# Patient Record
Sex: Female | Born: 1975 | Race: White | Hispanic: No | Marital: Married | Smoking: Never smoker
Health system: Southern US, Community
[De-identification: ages and names within clinical notes are randomized; demographics above are authoritative.]

## PROBLEM LIST (undated history)

## (undated) DIAGNOSIS — D649 Anemia, unspecified: Secondary | ICD-10-CM

## (undated) DIAGNOSIS — F909 Attention-deficit hyperactivity disorder, unspecified type: Secondary | ICD-10-CM

## (undated) DIAGNOSIS — R51 Headache: Secondary | ICD-10-CM

## (undated) DIAGNOSIS — R519 Headache, unspecified: Secondary | ICD-10-CM

## (undated) DIAGNOSIS — T7840XA Allergy, unspecified, initial encounter: Secondary | ICD-10-CM

## (undated) DIAGNOSIS — G7112 Myotonia congenita: Secondary | ICD-10-CM

## (undated) DIAGNOSIS — T8859XA Other complications of anesthesia, initial encounter: Secondary | ICD-10-CM

## (undated) DIAGNOSIS — F411 Generalized anxiety disorder: Secondary | ICD-10-CM

## (undated) HISTORY — DX: Attention-deficit hyperactivity disorder, unspecified type: F90.9

## (undated) HISTORY — DX: Myotonia congenita: G71.12

## (undated) HISTORY — DX: Anemia, unspecified: D64.9

## (undated) HISTORY — DX: Generalized anxiety disorder: F41.1

## (undated) HISTORY — PX: COSMETIC SURGERY: SHX468

## (undated) HISTORY — PX: OTHER SURGICAL HISTORY: SHX169

## (undated) HISTORY — PX: MANDIBLE SURGERY: SHX707

## (undated) HISTORY — DX: Headache, unspecified: R51.9

## (undated) HISTORY — DX: Allergy, unspecified, initial encounter: T78.40XA

## (undated) HISTORY — PX: REDUCTION MAMMAPLASTY: SUR839

## (undated) HISTORY — DX: Headache: R51

---

## 2002-01-11 ENCOUNTER — Other Ambulatory Visit: Admission: RE | Admit: 2002-01-11 | Discharge: 2002-01-11 | Payer: Self-pay | Admitting: Obstetrics and Gynecology

## 2003-06-01 ENCOUNTER — Other Ambulatory Visit: Admission: RE | Admit: 2003-06-01 | Discharge: 2003-06-01 | Payer: Self-pay | Admitting: Obstetrics and Gynecology

## 2003-06-27 ENCOUNTER — Ambulatory Visit (HOSPITAL_COMMUNITY): Admission: RE | Admit: 2003-06-27 | Discharge: 2003-06-27 | Payer: Self-pay | Admitting: Orthopedic Surgery

## 2003-11-24 HISTORY — PX: OTHER SURGICAL HISTORY: SHX169

## 2004-08-11 ENCOUNTER — Other Ambulatory Visit: Admission: RE | Admit: 2004-08-11 | Discharge: 2004-08-11 | Payer: Self-pay | Admitting: Obstetrics and Gynecology

## 2004-11-25 ENCOUNTER — Ambulatory Visit: Payer: Self-pay | Admitting: Family Medicine

## 2005-01-20 ENCOUNTER — Ambulatory Visit: Payer: Self-pay | Admitting: Family Medicine

## 2005-04-27 ENCOUNTER — Ambulatory Visit: Payer: Self-pay | Admitting: Family Medicine

## 2005-08-31 ENCOUNTER — Other Ambulatory Visit: Admission: RE | Admit: 2005-08-31 | Discharge: 2005-08-31 | Payer: Self-pay | Admitting: Obstetrics and Gynecology

## 2005-09-30 ENCOUNTER — Emergency Department (HOSPITAL_COMMUNITY): Admission: EM | Admit: 2005-09-30 | Discharge: 2005-09-30 | Payer: Self-pay | Admitting: Emergency Medicine

## 2006-02-09 ENCOUNTER — Ambulatory Visit: Payer: Self-pay | Admitting: Family Medicine

## 2006-02-15 ENCOUNTER — Ambulatory Visit: Payer: Self-pay | Admitting: Family Medicine

## 2008-02-06 ENCOUNTER — Ambulatory Visit: Payer: Self-pay | Admitting: Family Medicine

## 2008-02-06 DIAGNOSIS — Z9189 Other specified personal risk factors, not elsewhere classified: Secondary | ICD-10-CM | POA: Insufficient documentation

## 2008-02-06 DIAGNOSIS — D649 Anemia, unspecified: Secondary | ICD-10-CM

## 2008-02-06 DIAGNOSIS — H60399 Other infective otitis externa, unspecified ear: Secondary | ICD-10-CM | POA: Insufficient documentation

## 2008-06-07 ENCOUNTER — Telehealth: Payer: Self-pay | Admitting: Family Medicine

## 2008-06-25 ENCOUNTER — Ambulatory Visit: Payer: Self-pay | Admitting: Family Medicine

## 2008-06-25 DIAGNOSIS — M461 Sacroiliitis, not elsewhere classified: Secondary | ICD-10-CM

## 2008-09-06 ENCOUNTER — Telehealth: Payer: Self-pay | Admitting: Family Medicine

## 2008-09-07 ENCOUNTER — Ambulatory Visit: Payer: Self-pay | Admitting: Family Medicine

## 2008-09-07 DIAGNOSIS — J069 Acute upper respiratory infection, unspecified: Secondary | ICD-10-CM | POA: Insufficient documentation

## 2009-01-03 ENCOUNTER — Inpatient Hospital Stay (HOSPITAL_COMMUNITY): Admission: AD | Admit: 2009-01-03 | Discharge: 2009-01-04 | Payer: Self-pay | Admitting: Obstetrics and Gynecology

## 2009-02-11 ENCOUNTER — Inpatient Hospital Stay (HOSPITAL_COMMUNITY): Admission: RE | Admit: 2009-02-11 | Discharge: 2009-02-14 | Payer: Self-pay | Admitting: Obstetrics and Gynecology

## 2009-02-15 ENCOUNTER — Encounter: Admission: RE | Admit: 2009-02-15 | Discharge: 2009-03-17 | Payer: Self-pay | Admitting: Obstetrics and Gynecology

## 2009-03-18 ENCOUNTER — Encounter: Admission: RE | Admit: 2009-03-18 | Discharge: 2009-04-16 | Payer: Self-pay | Admitting: Obstetrics and Gynecology

## 2009-04-09 ENCOUNTER — Ambulatory Visit: Payer: Self-pay | Admitting: Family Medicine

## 2009-04-09 DIAGNOSIS — L723 Sebaceous cyst: Secondary | ICD-10-CM

## 2009-04-09 DIAGNOSIS — H6120 Impacted cerumen, unspecified ear: Secondary | ICD-10-CM

## 2009-04-17 ENCOUNTER — Encounter: Admission: RE | Admit: 2009-04-17 | Discharge: 2009-05-17 | Payer: Self-pay | Admitting: Obstetrics and Gynecology

## 2009-05-18 ENCOUNTER — Encounter: Admission: RE | Admit: 2009-05-18 | Discharge: 2009-06-16 | Payer: Self-pay | Admitting: Obstetrics and Gynecology

## 2009-06-17 ENCOUNTER — Encounter: Admission: RE | Admit: 2009-06-17 | Discharge: 2009-07-17 | Payer: Self-pay | Admitting: Obstetrics and Gynecology

## 2009-07-18 ENCOUNTER — Encounter: Admission: RE | Admit: 2009-07-18 | Discharge: 2009-08-17 | Payer: Self-pay | Admitting: Obstetrics and Gynecology

## 2009-08-05 ENCOUNTER — Ambulatory Visit: Payer: Self-pay | Admitting: Family Medicine

## 2009-08-05 DIAGNOSIS — M546 Pain in thoracic spine: Secondary | ICD-10-CM

## 2009-08-05 DIAGNOSIS — B354 Tinea corporis: Secondary | ICD-10-CM | POA: Insufficient documentation

## 2009-08-18 ENCOUNTER — Encounter: Admission: RE | Admit: 2009-08-18 | Discharge: 2009-09-16 | Payer: Self-pay | Admitting: Obstetrics and Gynecology

## 2009-09-05 ENCOUNTER — Ambulatory Visit: Payer: Self-pay | Admitting: Family Medicine

## 2009-09-17 ENCOUNTER — Encounter: Admission: RE | Admit: 2009-09-17 | Discharge: 2009-10-17 | Payer: Self-pay | Admitting: Obstetrics and Gynecology

## 2009-10-18 ENCOUNTER — Encounter: Admission: RE | Admit: 2009-10-18 | Discharge: 2009-11-16 | Payer: Self-pay | Admitting: Obstetrics and Gynecology

## 2009-10-29 ENCOUNTER — Telehealth: Payer: Self-pay | Admitting: Family Medicine

## 2009-11-20 ENCOUNTER — Ambulatory Visit: Payer: Self-pay | Admitting: Psychology

## 2009-12-02 ENCOUNTER — Ambulatory Visit: Payer: Self-pay | Admitting: Psychology

## 2009-12-12 ENCOUNTER — Ambulatory Visit: Payer: Self-pay | Admitting: Psychology

## 2009-12-17 ENCOUNTER — Ambulatory Visit: Payer: Self-pay | Admitting: Psychology

## 2009-12-18 ENCOUNTER — Encounter: Admission: RE | Admit: 2009-12-18 | Discharge: 2010-01-17 | Payer: Self-pay | Admitting: Obstetrics and Gynecology

## 2009-12-25 ENCOUNTER — Ambulatory Visit: Payer: Self-pay | Admitting: Psychology

## 2010-01-07 ENCOUNTER — Telehealth: Payer: Self-pay | Admitting: Family Medicine

## 2010-01-07 ENCOUNTER — Ambulatory Visit: Payer: Self-pay | Admitting: Psychology

## 2010-01-16 ENCOUNTER — Ambulatory Visit: Payer: Self-pay | Admitting: Psychology

## 2010-01-18 ENCOUNTER — Encounter: Admission: RE | Admit: 2010-01-18 | Discharge: 2010-02-17 | Payer: Self-pay | Admitting: Obstetrics and Gynecology

## 2010-02-06 ENCOUNTER — Ambulatory Visit: Payer: Self-pay | Admitting: Psychology

## 2010-02-13 ENCOUNTER — Ambulatory Visit: Payer: Self-pay | Admitting: Psychology

## 2010-02-18 ENCOUNTER — Ambulatory Visit: Payer: Self-pay | Admitting: Family Medicine

## 2010-02-18 LAB — CONVERTED CEMR LAB
Bilirubin Urine: NEGATIVE
Glucose, Urine, Semiquant: NEGATIVE
Ketones, urine, test strip: NEGATIVE
Nitrite: NEGATIVE
Protein, U semiquant: NEGATIVE
Specific Gravity, Urine: 1.025
Urobilinogen, UA: 0.2
WBC Urine, dipstick: NEGATIVE
pH: 5.5

## 2010-02-19 LAB — CONVERTED CEMR LAB
ALT: 17 units/L (ref 0–35)
AST: 18 units/L (ref 0–37)
Albumin: 4 g/dL (ref 3.5–5.2)
Alkaline Phosphatase: 49 units/L (ref 39–117)
BUN: 9 mg/dL (ref 6–23)
Basophils Absolute: 0.1 10*3/uL (ref 0.0–0.1)
Basophils Relative: 0.6 % (ref 0.0–3.0)
Bilirubin, Direct: 0.1 mg/dL (ref 0.0–0.3)
CO2: 29 meq/L (ref 19–32)
Calcium: 8.9 mg/dL (ref 8.4–10.5)
Chloride: 105 meq/L (ref 96–112)
Cholesterol: 159 mg/dL (ref 0–200)
Creatinine, Ser: 0.8 mg/dL (ref 0.4–1.2)
Eosinophils Absolute: 0.1 10*3/uL (ref 0.0–0.7)
Eosinophils Relative: 1 % (ref 0.0–5.0)
GFR calc non Af Amer: 87.82 mL/min (ref >60)
Glucose, Bld: 92 mg/dL (ref 70–99)
HCT: 39.1 % (ref 36.0–46.0)
HDL: 41.7 mg/dL (ref >39.00)
Hemoglobin: 12.8 g/dL (ref 12.0–15.0)
LDL Cholesterol: 90 mg/dL (ref 0–99)
Lymphocytes Relative: 20.7 % (ref 12.0–46.0)
Lymphs Abs: 1.8 10*3/uL (ref 0.7–4.0)
MCHC: 32.7 g/dL (ref 30.0–36.0)
MCV: 88.5 fL (ref 78.0–100.0)
Monocytes Absolute: 0.6 10*3/uL (ref 0.1–1.0)
Monocytes Relative: 7 % (ref 3.0–12.0)
Neutro Abs: 6.2 10*3/uL (ref 1.4–7.7)
Neutrophils Relative %: 70.7 % (ref 43.0–77.0)
Platelets: 252 10*3/uL (ref 150.0–400.0)
Potassium: 4.3 meq/L (ref 3.5–5.1)
RBC: 4.41 M/uL (ref 3.87–5.11)
RDW: 12.1 % (ref 11.5–14.6)
Sodium: 141 meq/L (ref 135–145)
TSH: 2 microintl units/mL (ref 0.35–5.50)
Total Bilirubin: 0.7 mg/dL (ref 0.3–1.2)
Total CHOL/HDL Ratio: 4
Total Protein: 7.1 g/dL (ref 6.0–8.3)
Triglycerides: 138 mg/dL (ref 0.0–149.0)
VLDL: 27.6 mg/dL (ref 0.0–40.0)
WBC: 8.8 10*3/uL (ref 4.5–10.5)

## 2010-02-26 ENCOUNTER — Ambulatory Visit: Payer: Self-pay | Admitting: Family Medicine

## 2010-02-27 ENCOUNTER — Ambulatory Visit: Payer: Self-pay | Admitting: Psychology

## 2010-03-13 ENCOUNTER — Telehealth: Payer: Self-pay | Admitting: Family Medicine

## 2010-03-20 ENCOUNTER — Encounter: Admission: RE | Admit: 2010-03-20 | Discharge: 2010-04-02 | Payer: Self-pay | Admitting: Obstetrics and Gynecology

## 2010-03-27 ENCOUNTER — Ambulatory Visit: Payer: Self-pay | Admitting: Psychology

## 2010-04-10 ENCOUNTER — Ambulatory Visit: Payer: Self-pay | Admitting: Psychology

## 2010-04-24 ENCOUNTER — Ambulatory Visit: Payer: Self-pay | Admitting: Psychology

## 2010-04-25 ENCOUNTER — Telehealth: Payer: Self-pay | Admitting: Family Medicine

## 2010-04-27 DIAGNOSIS — J209 Acute bronchitis, unspecified: Secondary | ICD-10-CM

## 2010-05-01 ENCOUNTER — Ambulatory Visit: Payer: Self-pay | Admitting: Psychology

## 2010-05-02 ENCOUNTER — Ambulatory Visit: Payer: Self-pay | Admitting: Family Medicine

## 2010-05-02 ENCOUNTER — Telehealth: Payer: Self-pay | Admitting: Family Medicine

## 2010-05-02 DIAGNOSIS — E669 Obesity, unspecified: Secondary | ICD-10-CM | POA: Insufficient documentation

## 2010-05-05 ENCOUNTER — Telehealth (INDEPENDENT_AMBULATORY_CARE_PROVIDER_SITE_OTHER): Payer: Self-pay | Admitting: *Deleted

## 2010-05-06 ENCOUNTER — Telehealth: Payer: Self-pay | Admitting: Family Medicine

## 2010-05-08 ENCOUNTER — Ambulatory Visit: Payer: Self-pay | Admitting: Psychology

## 2010-05-15 ENCOUNTER — Ambulatory Visit: Payer: Self-pay | Admitting: Psychology

## 2010-05-27 ENCOUNTER — Ambulatory Visit: Payer: Self-pay | Admitting: Psychology

## 2010-11-18 ENCOUNTER — Telehealth: Payer: Self-pay | Admitting: Family Medicine

## 2010-12-25 NOTE — Progress Notes (Signed)
Summary: need another rx  Phone Note Call from Patient Call back at Home Phone [phone]   Caller: Patient-live call Summary of Call: Has another yeast infection and would like another rx for the cream. Please return call. Monistat does'nt help. Initial call taken by: Warnell Forester,  March 13, 2010 2:53 PM  Follow-up for Phone Call        [facility] Follow-up by: Raechel Ache, RN,  March 17, 2010 11:33 AM  Additional Follow-up for Phone Call Additional follow up Details #1::        has yeast inf- says you've given her a cream in the past - Monistat doesn't help. [facility] Additional Follow-up by: Raechel Ache, RN,  March 17, 2010 12:09 PM    Additional Follow-up for Phone Call Additional follow up Details #2::    call in Terazol 7 day cream, use as directed, with 5 rf Follow-up by: Nelwyn Salisbury MD,  March 17, 2010 1:57 PM  New/Updated Medications: TERAZOL 3 0.8 % CREA (TERCONAZOLE) UAD Prescriptions: TERAZOL 3 0.8 % CREA (TERCONAZOLE) UAD  #30 x 5   Entered by:   Raechel Ache, RN   Authorized by:   Nelwyn Salisbury MD   Signed by:   Raechel Ache, RN on 03/17/2010   Method used:   Electronically to        [facility] [address] [phone]* (retail)       [address]       [place], [place]  [address]       Ph: [phone] or [phone]       Fax: [phone]   RxID:   [id number]

## 2010-12-25 NOTE — Assessment & Plan Note (Signed)
Summary: ear pain/dm   Vital Signs:  Patient profile:   35 year old female Menstrual status:  regular Height:      62.5 inches (158.75 cm) Weight:      222 pounds (100.91 kg) O2 Sat:      98 % on Room air Temp:     98.3 degrees F (36.83 degrees C) oral Pulse rate:   105 / minute BP sitting:   124 / 78  (left arm) Cuff size:   large  Vitals Entered By: Josph Macho RMA (May 02, 2010 11:25 AM)  O2 Flow:  Room air CC: ear pain in right ear- noticed blood in ear last night/pt states she is no longer using Ketoconazole or Terazol/ pt also stated she lost RX for Phentermine so she hasn't started/  CF Is Patient Diabetic? No   History of Present Illness: Patient in with recent history of sinusitis treated with a Z pack. She was struggling with facial pressure/congestion and malaise prior to the Zpack and those symptoms have improved somewhat but she is having a worsening cough and now chest congestion. Cough is disrupting her sleep and she has trouble with coughing spasms, Tylenol pm has been helping her rest some but she has not tried any other OTC meds. She acknowledges being a mouth breather and even in good health she reports trouble breathing through her nose. She denies f/c/ear pain/CP/palp/SOB/pharyngitis/GI c/o. Is struggling with fatigue and weight gain  Allergies: No Known Drug Allergies  Past History:  Past medical history reviewed for relevance to current acute and chronic problems. Social history (including risk factors) reviewed for relevance to current acute and chronic problems.  Past Medical History: Reviewed history from 02/26/2010 and no changes required. Anemia-NOS Chickenpox sees Dr. Laruth Bouchard for GYN exams  Social History: Reviewed history from 07/19/2007 and no changes required. Single Never Smoked Alcohol use-yes Drug use-no  Review of Systems      See HPI  Physical Exam  General:  Well-developed,well-nourished,in no acute distress;  alert,appropriate and cooperative throughout examination Head:  Normocephalic and atraumatic without obvious abnormalities. Eyes:  No corneal or conjunctival inflammation noted. EOMI. Perrla. Funduscopic exam benign, without hemorrhages, exudates or papilledema. Vision grossly normal. Ears:  no external deformities.  No erythema or discharge in canals, Air fluid levels noted behind b/l TMs Neck:  No deformities, masses, or tenderness noted. Lungs:  normal respiratory effort, no accessory muscle use, and no wheezes.  Rhonchi noted in LUL most notably Heart:  Normal rate and regular rhythm. S1 and S2 normal without gallop, murmur, click, rub or other extra sounds. Abdomen:  Bowel sounds positive,abdomen soft and non-tender without masses, organomegaly or hernias noted. Extremities:  No clubbing, cyanosis, edema, or deformity noted with normal full range of motion of all joints.   Psych:  Cognition and judgment appear intact. Alert and cooperative with normal attention span and concentration. No apparent delusions, illusions, hallucinations   ENT Exam:  Left Ear:     External: Pinna and periauricular area is normal.       Canal: Ear canal skin is not inflamed or edematous.       Tympanic Membrane: MEE-serous.   Right Ear:     External: Pinna and periauricular area is normal.       Canal: Ear canal skin is not inflamed or edematous.       Tympanic Membrane: MEE-serous.   Nose:     External: No deformity or lesions noted.  Septum: Midline and intact.       Mucosa: pale edema.       Turbinates: hypertrophy.  Narrow nasal passages Pharynx:     Oral Cavity: Lips, gums, and teeth are unremarkable. Tongue is mobile.      Oropharynx: Pharynx without signs of inflammation, tonsils unremarkable, palate with bilateral rise.    Impression & Recommendations:  Problem # 1:  ACUTE BRONCHITIS (ICD-466.0)  Her updated medication list for this problem includes:    Ciprofloxacin Hcl 500 Mg Tabs  (Ciprofloxacin hcl) .Marland Kitchen... 1 tab by mouth two times a day x 10day. Recommended Mucinex OTC two times a day for next 10 days, given sample of Omnaris 2 sprays each day as needed congestion and if helpful given an rx for Fluticasone nasal spray to try if ongoing congestion. Narrow nasal passages noted, consider ENT evaluation if symptoms of sinusitis and allergies persist.  Problem # 2:  OVERWEIGHT (ICD-278.02) Encouraged 7-8 hours of sleep each night, increased exercise, avoid trans fats and avoid partially hydrogenated oils.  Complete Medication List: 1)  Ketoconazole 2 % Crea (Ketoconazole) .... Apply three times a day as needed 2)  Phentermine Hcl 37.5 Mg Caps (Phentermine hcl) .... Once daily 3)  Terazol 3 0.8 % Crea (Terconazole) .... Uad 4)  Omnaris 50 Mcg/act Susp (Ciclesonide) .... 2 sprays each nostril daily as needed for congestion 5)  Fluticasone Propionate 50 Mcg/act Susp (Fluticasone propionate) .... 2 sprays each nostril once daily 6)  Ciprofloxacin Hcl 500 Mg Tabs (Ciprofloxacin hcl) .Marland Kitchen.. 1 tab by mouth two times a day x 10day  Patient Instructions: 1)  Please schedule a follow-up appointment as needed if no symptom resolution. Try Omnaris 2 sprays each nostril daily as needed for nasal congestion. Use Mucinex OTC twice daily for next 10 days as well. 2)  Acute Bronchitis symptoms for less then 10 days are not  helped by antibiotics. Take over the counter cough medications. Call if no improvement in 5-7 days, sooner if increasing cough, fever, or new symptoms ( shortness of breath, chest pain) .  Prescriptions: CIPROFLOXACIN HCL 500 MG TABS (CIPROFLOXACIN HCL) 1 tab by mouth two times a day x 10day  #20 x 0   Entered and Authorized by:   Danise Edge MD   Signed by:   Danise Edge MD on 05/02/2010   Method used:   Electronically to        [facility] [address] [phone]* (retail)       [address]       [place], [place]  [address]       Ph: [phone] or  [phone]       Fax: [phone]   RxID:   [id number] FLUTICASONE PROPIONATE 50 MCG/ACT  SUSP (FLUTICASONE PROPIONATE) 2 sprays each nostril once daily  #1 vial x 1   Entered and Authorized by:   Danise Edge MD   Signed by:   Danise Edge MD on 05/02/2010   Method used:   Electronically to        [facility] [address] [phone]* (retail)       [address]       [place], [place]  [address]       Ph: [phone] or [phone]       Fax: [phone]   RxID:   [id number] OMNARIS 50 MCG/ACT SUSP (CICLESONIDE) 2 sprays each nostril daily as needed  for congestion  #1 x 0   Entered and Authorized by:   Danise Edge MD   Signed by:   Danise Edge MD on 05/02/2010   Method used:   Samples Given   RxID:   [id number]

## 2010-12-25 NOTE — Progress Notes (Signed)
Summary: f/u otitis  Phone Note Call from Patient   Summary of Call: call from nurse line  Lori Zavala did not get her ear drop today at her visit from Dr Rogelia Rohrer  she got cipro and nasal spray  Lori Zavala phone is [phone]   Follow-up for Phone Call        I reviewed Lori Zavala's note from Dr Rogelia Rohrer with the Lori Zavala in detail I did not notice anything in her progress note about ear canal findings or otitis externa  there is no mention of ear drops of any kind I explained this to Lori Zavala in detail  perhaps the oral cipro would cover all problems ?  Lori Zavala is not having pain now- but is worried about developing ear "canal" infection because that is what always happens when she gets a uri  I told her again - there was no ear drop recommended we agreed that if she develops any symptoms of otitis externa to let me know asap or come into saturday clinic  Follow-up by: Judith Part MD,  May 02, 2010 10:31 PM  Additional Follow-up for Phone Call Additional follow up Details #1::        Reviewed phone note, patient treatment should be adequate with by mouth antibiotics, if symptoms worsen patient has already been instructed to seek further care. Additional Follow-up by: Danise Edge MD,  May 05, 2010 4:38 PM

## 2010-12-25 NOTE — Assessment & Plan Note (Signed)
Summary: cpx/no pap/njr   Vital Signs:  Patient profile:   35 year old female Menstrual status:  regular LMP:     02/12/2010 Height:      62.5 inches Weight:      222 pounds BMI:     40.10 Temp:     98.3 degrees F oral Pulse rate:   84 / minute Pulse rhythm:   regular Resp:     12 per minute BP sitting:   108 / 80  (left arm)  Vitals Entered By: Gladis Riffle, RN (February 26, 2010 3:07 PM) CC: cpx, has gyn, labs done Is Patient Diabetic? No LMP (date): 02/12/2010     Menstrual Status regular Enter LMP: 02/12/2010   History of Present Illness: 35 yr old female for a cpx. She feels good physically except for frequent diarrhea. This persists depsite any dietary changes she makes. She thinks it is related to stress, and after talking to her I agree. She has been under a lot of stress lately since her one year old infant has failure to thrive. He is at the 25th percentile for height but at less than 1% for weight. his pediatrician has refered them to Lori Zavala for more workup. he sleeps all the time and won't eat much. She worries about him all the time. She has been seeing Dr. Dellia Cloud weekly for therapy, and this has helped some. She also would like some help losing weight. She watches her diet but has little time for exercise.   Preventive Screening-Counseling & Management  Alcohol-Tobacco     Smoking Status: never  Current Medications (verified): 1)  Ketoconazole 2 % Crea (Ketoconazole) .... Apply Three Times A Day As Needed  Allergies (verified): No Known Drug Allergies  Past History:  Past Medical History: Anemia-NOS Chickenpox sees Dr. Laruth Bouchard for GYN exams  Past Surgical History: Reviewed history from 02/06/2008 and no changes required. Jaw surgery 1992 Caesarean section  Family History: Reviewed history from 07/19/2007 and no changes required. Family History of Alcoholism/Addiction Family History Other cancer-breast Family History Diabetes 1st degree  relative Family History High cholesterol Family History Hypertension  Social History: Reviewed history from 07/19/2007 and no changes required. Single Never Smoked Alcohol use-yes Drug use-no  Review of Systems  The patient denies anorexia, fever, weight loss, vision loss, decreased hearing, hoarseness, chest pain, syncope, dyspnea on exertion, peripheral edema, prolonged cough, headaches, hemoptysis, abdominal pain, melena, hematochezia, severe indigestion/heartburn, hematuria, incontinence, genital sores, muscle weakness, suspicious skin lesions, transient blindness, difficulty walking, depression, unusual weight change, abnormal bleeding, enlarged lymph nodes, angioedema, breast masses, and testicular masses.    Physical Exam  General:  overweight-appearing.   Head:  Normocephalic and atraumatic without obvious abnormalities. No apparent alopecia or balding. Eyes:  No corneal or conjunctival inflammation noted. EOMI. Perrla. Funduscopic exam benign, without hemorrhages, exudates or papilledema. Vision grossly normal. Ears:  External ear exam shows no significant lesions or deformities.  Otoscopic examination reveals clear canals, tympanic membranes are intact bilaterally without bulging, retraction, inflammation or discharge. Hearing is grossly normal bilaterally. Nose:  External nasal examination shows no deformity or inflammation. Nasal mucosa are pink and moist without lesions or exudates. Mouth:  Oral mucosa and oropharynx without lesions or exudates.  Teeth in good repair. Neck:  No deformities, masses, or tenderness noted. Chest Wall:  No deformities, masses, or tenderness noted. Lungs:  Normal respiratory effort, chest expands symmetrically. Lungs are clear to auscultation, no crackles or wheezes. Heart:  Normal rate and regular rhythm.  S1 and S2 normal without gallop, murmur, click, rub or other extra sounds. Abdomen:  Bowel sounds positive,abdomen soft and non-tender without  masses, organomegaly or hernias noted. Msk:  No deformity or scoliosis noted of thoracic or lumbar spine.   Pulses:  R and L carotid,radial,femoral,dorsalis pedis and posterior tibial pulses are full and equal bilaterally Extremities:  No clubbing, cyanosis, edema, or deformity noted with normal full range of motion of all joints.   Neurologic:  No cranial nerve deficits noted. Station and gait are normal. Plantar reflexes are down-going bilaterally. DTRs are symmetrical throughout. Sensory, motor and coordinative functions appear intact. Skin:  Intact without suspicious lesions or rashes Cervical Nodes:  No lymphadenopathy noted Axillary Nodes:  No palpable lymphadenopathy Inguinal Nodes:  No significant adenopathy Psych:  Oriented X3, memory intact for recent and remote, normally interactive, good eye contact, and moderately anxious.     Impression & Recommendations:  Problem # 1:  HEALTH MAINTENANCE EXAM (ICD-V70.0)  Complete Medication List: 1)  Ketoconazole 2 % Crea (Ketoconazole) .... Apply three times a day as needed 2)  Phentermine Hcl 37.5 Mg Caps (Phentermine hcl) .... Once daily  Patient Instructions: 1)  It is important that you exercise reguarly at least 20 minutes 5 times a week. If you develop chest pain, have severe difficulty breathing, or feel very tired, stop exercising immediately and seek medical attention.  2)  You need to lose weight. Consider a lower calorie diet and regular exercise.  Prescriptions: PHENTERMINE HCL 37.5 MG CAPS (PHENTERMINE HCL) once daily  #30 x 5   Entered and Authorized by:   Nelwyn Salisbury MD   Signed by:   Nelwyn Salisbury MD on 02/26/2010   Method used:   Print then Give to Patient   RxID:   [id number] PHENTERMINE HCL 37.5 MG CAPS (PHENTERMINE HCL) once daily  #30 x 5   Entered and Authorized by:   Nelwyn Salisbury MD   Signed by:   Nelwyn Salisbury MD on 02/26/2010   Method used:   Print then Give to Patient   RxID:   [id number]

## 2010-12-25 NOTE — Progress Notes (Signed)
Summary: antibiotic  Phone Note Call from Patient Call back at [facility] Phone [phone]   Summary of Call: Sinus infection.  Yellow nasty mucus with some blood in it.  Throat drainage.  Painful between eyes & upper nose.  Cannot get babysitter for her 2 young children, ages 1 & 3, today.  [facility]  NKDA.  Not on any meds & knows she's not pregnant IUD.   Initial call taken by: Rudy Jew, RN,  April 25, 2010 12:18 PM  Follow-up for Phone Call        call in a Zpack Follow-up by: Nelwyn Salisbury MD,  April 25, 2010 1:03 PM  Additional Follow-up for Phone Call Additional follow up Details #1::        Rx Called In Additional Follow-up by: Raechel Ache, RN,  April 25, 2010 1:10 PM

## 2010-12-25 NOTE — Progress Notes (Signed)
Summary: Phentermine refill request  Phone Note From Pharmacy   [facility] [address] [phone]* Call For: Lori Zavala  Summary of Call: Faxed request to fill Phentermine 37.5.  Last filled 05/07/10 Initial call taken by: Sid Falcon LPN,  November 18, 2010 1:25 PM  Follow-up for Phone Call        call in #30 with 5 rf Follow-up by: Nelwyn Salisbury MD,  November 19, 2010 8:31 AM    Prescriptions: PHENTERMINE HCL 37.5 MG CAPS (PHENTERMINE HCL) once daily  #30 x 5   Entered by:   Kyung Rudd, CMA   Authorized by:   Nelwyn Salisbury MD   Signed by:   Kyung Rudd, CMA on 11/19/2010   Method used:   Telephoned to ...       [facility] [address] [phone]* (retail)       [address]       [place], [place]  [address]       Ph: [phone] or [phone]       Fax: [phone]   RxID:   [id number]  Rx phoned in

## 2010-12-25 NOTE — Progress Notes (Signed)
Summary: lost prescription  Phone Note Call from Patient   Caller: Patient Call For: Nelwyn Salisbury MD Summary of Call: Pt lost her Phentermine prescription before getting it filled.  Her son spent 2 weeks at [facility], and has not been diagnosed. [phone] Initial call taken by: Lynann Beaver CMA,  May 06, 2010 8:20 AM  Follow-up for Phone Call        call in Phnetermine 37.5 mg once daily , #30 with 5 rf Follow-up by: Nelwyn Salisbury MD,  May 07, 2010 12:28 PM    Prescriptions: PHENTERMINE HCL 37.5 MG CAPS (PHENTERMINE HCL) once daily  #30 x 5   Entered by:   Raechel Ache, RN   Authorized by:   Nelwyn Salisbury MD   Signed by:   Raechel Ache, RN on 05/07/2010   Method used:   Printed then faxed to ...       [facility] [address] [phone]* (retail)       [address]       [place], [place]  [address]       Ph: [phone] or [phone]       Fax: [phone]   RxID:   [id number]

## 2010-12-25 NOTE — Progress Notes (Signed)
Summary: Call-A-Nurse Report    Call-A-Nurse Triage Call Report Triage Record Num: [id number] Operator: Albertine Grates Patient Name: Lori Zavala Call Date & Time: 05/02/2010 9:43:02PM Patient Phone: [phone] PCP: Tera Mater. Clent Ridges Patient Gender: Female PCP Fax : [phone] Patient DOB: May 22, 1976 Practice Name: [facility] Reason for Call: Was seen in office 6-10 due to ear infection. Was given Cipro for fluid in lung. Was told has redness of ear canal and MD was to call in ear drops but did not receive drops. Dr. Milinda Antis notified and advised would look up and call pt. Protocol(s) Used: Office Note Recommended Outcome per Protocol: Information Noted and Sent to Office Reason for Outcome: Caller information to office Care Advice:  ~ 05/02/2010 10:14:16PM Page 1 of 1 [id number]  See phone note dated 05/02/10 by Dr Milinda Antis

## 2010-12-25 NOTE — Progress Notes (Signed)
Summary: vomiting & diarrhea  Phone Note Call from Patient   Caller: Patient Call For: Nelwyn Salisbury MD Summary of Call: baby has Norovirus and she has had it since this morning- vomiting & diarrhea. Are there any meds to help her? [facility] Initial call taken by: Raechel Ache, RN,  January 07, 2010 4:09 PM  Follow-up for Phone Call        call in Phenergan 25 mg tabs to use q 4 hours as needed nausea, #30 with no rf. She can use Imodium AD for the diarrhea. Drink plenty of fluids.  Follow-up by: Nelwyn Salisbury MD,  January 07, 2010 5:03 PM  Additional Follow-up for Phone Call Additional follow up Details #1::        Pt. notified. Additional Follow-up by: Lynann Beaver CMA,  January 07, 2010 5:29 PM    New/Updated Medications: PROMETHAZINE HCL 25 MG TABS (PROMETHAZINE HCL) one by mouth q 4 hours as needed nausea Prescriptions: PROMETHAZINE HCL 25 MG TABS (PROMETHAZINE HCL) one by mouth q 4 hours as needed nausea  #30 x 0   Entered by:   Lynann Beaver CMA   Authorized by:   Nelwyn Salisbury MD   Signed by:   Lynann Beaver CMA on 01/07/2010   Method used:   Electronically to        [facility] [address] [phone]* (retail)       [address]       [place], [place]  [address]       Ph: [phone] or [phone]       Fax: [phone]   RxID:   [id number]  Pt. notified.

## 2011-03-05 LAB — CBC
HCT: 29.4 % — ABNORMAL LOW (ref 36.0–46.0)
HCT: 33.7 % — ABNORMAL LOW (ref 36.0–46.0)
Hemoglobin: 10 g/dL — ABNORMAL LOW (ref 12.0–15.0)
Hemoglobin: 11.3 g/dL — ABNORMAL LOW (ref 12.0–15.0)
MCHC: 33.5 g/dL (ref 30.0–36.0)
MCHC: 34.1 g/dL (ref 30.0–36.0)
MCV: 87.3 fL (ref 78.0–100.0)
MCV: 87.6 fL (ref 78.0–100.0)
Platelets: 157 10*3/uL (ref 150–400)
Platelets: 211 10*3/uL (ref 150–400)
RBC: 3.36 MIL/uL — ABNORMAL LOW (ref 3.87–5.11)
RBC: 3.85 MIL/uL — ABNORMAL LOW (ref 3.87–5.11)
RDW: 14 % (ref 11.5–15.5)
RDW: 14.1 % (ref 11.5–15.5)
WBC: 8.2 10*3/uL (ref 4.0–10.5)
WBC: 8.6 10*3/uL (ref 4.0–10.5)

## 2011-03-05 LAB — CCBB MATERNAL DONOR DRAW

## 2011-03-05 LAB — TYPE AND SCREEN
ABO/RH(D): A POS
Antibody Screen: NEGATIVE

## 2011-03-05 LAB — RPR: RPR Ser Ql: NONREACTIVE

## 2011-03-05 LAB — ABO/RH: ABO/RH(D): A POS

## 2011-03-10 LAB — URINALYSIS, ROUTINE W REFLEX MICROSCOPIC
Bilirubin Urine: NEGATIVE
Glucose, UA: NEGATIVE mg/dL
Hgb urine dipstick: NEGATIVE
Ketones, ur: 15 mg/dL — AB
Nitrite: NEGATIVE
Protein, ur: NEGATIVE mg/dL
Specific Gravity, Urine: 1.025 (ref 1.005–1.030)
Urobilinogen, UA: 0.2 mg/dL (ref 0.0–1.0)
pH: 6 (ref 5.0–8.0)

## 2011-03-10 LAB — FETAL FIBRONECTIN: Fetal Fibronectin: NEGATIVE

## 2011-04-07 NOTE — Op Note (Signed)
Lori Zavala, Lori Zavala                ACCOUNT NO.:  [id number]   MEDICAL RECORD NO.:  [id number]         PATIENT TYPE:  WINP   LOCATION:                                FACILITY:  [facility]   PHYSICIAN:  Zelphia Cairo, MD    DATE OF BIRTH:  28-May-1976   DATE OF PROCEDURE:  DATE OF DISCHARGE:  02/14/2009                               OPERATIVE REPORT   DATE OF PROCEDURE:  February 11, 2009.   PREOPERATIVE DIAGNOSES:  1. Intrauterine pregnancy at term.  2. Prior cesarean section, desires repeat.   POSTOPERATIVE DIAGNOSES:  1. Intrauterine pregnancy at term.  2. Prior cesarean section, desires repeat.   PROCEDURE:  Repeat low-transverse cesarean delivery.   SURGEON:  Zelphia Cairo, MD   ANESTHESIA:  Spinal epidural.   FINDINGS:  Viable female infant with Apgars of 5 and 8, pH 7.3, weight 9.3  ounces, normal-appearing pelvis.   SPECIMEN:  Placenta for disposal.   COMPLICATIONS:  None.   CONDITION:  Stable to recovery room.   PROCEDURE:  [name] was taken to the operating room where spinal epidural  was found to be adequate.  She was prepped and draped in sterile fashion  and a Foley catheter was inserted sterilely.  A Pfannenstiel skin  incision was made with a scalpel and carried down to the underlying  fascia.  The fascia was incised in the midline.  This was extended  laterally using curved Mayo scissors.  Kocher clamps were used to grasp  the superior portion of the fascia.  This was tented upwards and the  underlying rectus muscles were dissected off using curved Mayo scissors.  The inferior portion of the rectus muscles were dissected off the fascia  using Mayo scissors.  The peritoneum was then tented upwards with 2  hemostats and entered sharply using Metzenbaum, this was extended  superiorly and inferiorly with good visualization of the bladder.  The  bladder blade was then inserted, the vesicouterine peritoneum was  incised and dissected off the lower uterine segment.  The  bladder blade  was then replaced.   Uterine incision was made with a scalpel and extended bluntly.  The  fetal vertex was brought through the uterine incision.  The mouth and  nose were suctioned.  Nuchal cord x1 was easily reduced and the  shoulders and body followed with fundal pressure.  The cord was clamped  and cut as the mouth and nose were suctioned and the infant was taken to  the awaiting pediatric staff.  The placenta was then manually removed  from the uterus.  The uterus was cleared of all clots and debris using a  dry lap sponge.  The uterine incision was reapproximated using a double  closure of 0 Chromic in a running locked fashion.  Hemostasis was  assured.  Uterus was placed back into the pelvis.  The pelvis was copiously irrigated with warm normal saline.  The uterine  incision was re-inspectedd and found to be hemostatic.  The peritoneum  was then reapproximated with 0 Monocryl.  The fascia was closed with a  looped 0 PDS  and the skin was closed with staples.  Sponge lap, needle,  and instrument counts were correct x2.      Zelphia Cairo, MD  Electronically Signed     GA/MEDQ  D:  02/11/2009  T:  02/11/2009  Job:  [id number]

## 2011-04-07 NOTE — Discharge Summary (Signed)
NAMEMARGOT, Zavala                ACCOUNT NO.:  [id number]   MEDICAL RECORD NO.:  [id number]          PATIENT TYPE:  INP   LOCATION:  9118                          FACILITY:  [facility]   PHYSICIAN:  Guy Sandifer. Henderson Cloud, M.D. DATE OF BIRTH:  Jul 04, 1976   DATE OF ADMISSION:  02/11/2009  DATE OF DISCHARGE:  02/14/2009                               DISCHARGE SUMMARY   ADMITTING DIAGNOSES:  1. Intrauterine pregnancy at term.  2. Previous cesarean section, desires repeat.   DISCHARGE DIAGNOSIS:  Status post low-transverse cesarean section to a  viable female infant.   PROCEDURE:  Repeat low-transverse cesarean section.   REASON FOR ADMISSION:  Please see written H&P.   HOSPITAL COURSE:  The patient is a 35 year old gravida 2, para 1 who was  admitted to [facility], [facility] for scheduled cesarean section.  The patient had a previous cesarean sectiona and desired a repeat.  On  the morning of admission, the patient was taken to the operating room  where a spinal anesthesia was administered without difficulty.  A low-  transverse incision was made with delivery of a viable female infant  weighing 9 pounds 3 ounces.  The Apgars were 5 at 1 minute and 8 at 5  minutes.  Arterial cord pH was 7.30.  The patient tolerated feeds well  and was taken to the recovery room in stable condition.  On  postoperative day #1, the patient was known to be sensitive to Percocet  and she had been given Dilaudid IV for control of pain.   Vital signs were stable.  She was afebrile.  Abdomen was soft with good  return of bowel function.  Fundus was firm and nontender.  Abdominal  dressings were noted to have a small amount of drainage noted on the  bandage.  Foley had been discontinued.  The patient was voiding well.   Laboratory findings showed hemoglobin of 10.0, platelet count 157,000,  WBC count of 8.6.  Blood type was known to be A+.  The patient's pain  medications were changed to Dilaudid p.o.  She was  able to shower and  dressing was subsequently removed.  On postoperative day #2, the patient  was having good relief of pain with Dilaudid; however, the baby was  somewhat sleepy.  Vital signs were stable.  She was afebrile.  Fundus  firm and nontender.  Incision was clean, dry, and intact.  She was  ambulating well.  On postoperative day #3, the patient was without  complaint.  Vital signs were stable.  She is afebrile.  Fundus is firm  and nontender.  There was some slight erythema noted superior to the  incisional site.  Staples were removed, Steri-Strips were applied and  the patient was later discharged home.   CONDITION ON DISCHARGE:  Stable.  Diet regular as tolerated.  Activity,  no heavy lifting, no driving x2 weeks, no vaginal entry.   FOLLOWUP:  The patient to follow up in the office in 1 week for an  incision check.  She is to call for temperature greater than 100  degrees, persistent  nausea, vomiting, heavy vaginal bleeding, and/or  redness or drainage from the incisional site.   DISCHARGE MEDICATIONS:  Motrin 600 mg every 6 hours, prenatal vitamins 1  p.o. daily, Keflex 500 mg one p.o. q.i.d. times 7 days.      Julio Sicks, N.P.      Guy Sandifer. Henderson Cloud, M.D.  Electronically Signed    CC/MEDQ  D:  02/14/2009  T:  02/14/2009  Job:  [id number]

## 2013-11-27 ENCOUNTER — Telehealth: Payer: Self-pay | Admitting: Family Medicine

## 2013-11-27 NOTE — Telephone Encounter (Signed)
Sorry but she will need to see someone else thanks

## 2013-11-27 NOTE — Telephone Encounter (Signed)
Patient would like to return as patient with Dr. Sarajane Jews because she has moved back into the area from [place]. I did advise her that he is not accepting new patients. Contact info [phone].

## 2013-11-29 NOTE — Telephone Encounter (Signed)
Pt is aware.  

## 2014-07-24 HISTORY — PX: BREAST SURGERY: SHX581

## 2014-11-23 HISTORY — PX: NASAL SEPTUM SURGERY: SHX37

## 2014-12-03 ENCOUNTER — Encounter: Payer: Self-pay | Admitting: Family Medicine

## 2014-12-03 ENCOUNTER — Ambulatory Visit (INDEPENDENT_AMBULATORY_CARE_PROVIDER_SITE_OTHER): Payer: 59 | Admitting: General Practice

## 2014-12-03 ENCOUNTER — Ambulatory Visit (INDEPENDENT_AMBULATORY_CARE_PROVIDER_SITE_OTHER): Payer: 59 | Admitting: Family Medicine

## 2014-12-03 VITALS — BP 110/80 | HR 80 | Temp 98.8°F | Resp 16 | Ht 63.0 in | Wt 198.2 lb

## 2014-12-03 DIAGNOSIS — E669 Obesity, unspecified: Secondary | ICD-10-CM

## 2014-12-03 DIAGNOSIS — Z23 Encounter for immunization: Secondary | ICD-10-CM

## 2014-12-03 MED ORDER — PHENTERMINE HCL 37.5 MG PO CAPS: 37.5000 mg | ORAL_CAPSULE | ORAL | Status: DC

## 2014-12-03 NOTE — Patient Instructions (Signed)
Follow up in 6 weeks to recheck weight loss progress, BP and pulse (due to phentermine) We'll notify you of your lab results and make any changes if needed Keep up the good work on healthy diet and regular exercise Call with any questions or concerns Welcome!  We're glad to have you!!!

## 2014-12-03 NOTE — Progress Notes (Signed)
Pre visit review using our clinic review tool, if applicable. No additional management support is needed unless otherwise documented below in the visit note. 

## 2014-12-03 NOTE — Assessment & Plan Note (Signed)
New to provider, ongoing issue for pt since birth of 2nd child 5 yrs ago.  Following w/ personal trainer for 6 workouts/week and meal plan but recently suffered ankle injury which has curbed her workouts for the near future.  Pt has done well on phentermine previously- losing 50 lbs and only regaining 8.  Willing to restart phentermine after checking labs to r/o thyroid abnormality that would prevent weight loss and checking lipids and electrolytes to risk stratify.  Applauded efforts to eat right and exercise.  Will follow closely.

## 2014-12-03 NOTE — Progress Notes (Signed)
   Subjective:    Patient ID: Lori Zavala, female    DOB: June 06, 1976, 39 y.o.   MRN: [id number]  HPI New to establish.  No recent PCP, prior to that [place].  GYN- [facility]- previously seeing Dr Gershon Crane but he is nearing retirement.  Needs referral.  Obesity- pt has personal trainer who is outlining 6 days of exercise and following meal plans.  Had grade 3 sprain of L ankle on 12/24.  Gained weight after 2nd pregnancy due to son's chronic illness.  Has taken Phentermine previously and lost 50 lbs.  Only gained 8 back after stopping phentermine.   Review of Systems For ROS see HPI     Objective:   Physical Exam  Constitutional: She is oriented to person, place, and time. She appears well-developed and well-nourished. No distress.  obese  HENT:  Head: Normocephalic and atraumatic.  Eyes: Conjunctivae and EOM are normal. Pupils are equal, round, and reactive to light.  Neck: Normal range of motion. Neck supple. No thyromegaly present.  Cardiovascular: Normal rate, regular rhythm, normal heart sounds and intact distal pulses.   No murmur heard. Pulmonary/Chest: Effort normal and breath sounds normal. No respiratory distress.  Abdominal: Soft. She exhibits no distension. There is no tenderness.  Musculoskeletal: She exhibits no edema.  Lymphadenopathy:    She has no cervical adenopathy.  Neurological: She is alert and oriented to person, place, and time.  Skin: Skin is warm and dry.  Psychiatric: She has a normal mood and affect. Her behavior is normal.  Vitals reviewed.         Assessment & Plan:

## 2014-12-04 LAB — CBC WITH DIFFERENTIAL/PLATELET
Basophils Absolute: 0.1 10*3/uL (ref 0.0–0.1)
Basophils Relative: 1.3 % (ref 0.0–3.0)
Eosinophils Absolute: 0.1 10*3/uL (ref 0.0–0.7)
Eosinophils Relative: 1.5 % (ref 0.0–5.0)
HCT: 38.1 % (ref 36.0–46.0)
Hemoglobin: 12.4 g/dL (ref 12.0–15.0)
Lymphocytes Relative: 28.1 % (ref 12.0–46.0)
Lymphs Abs: 2.2 10*3/uL (ref 0.7–4.0)
MCHC: 32.5 g/dL (ref 30.0–36.0)
MCV: 86.5 fl (ref 78.0–100.0)
Monocytes Absolute: 0.6 10*3/uL (ref 0.1–1.0)
Monocytes Relative: 7.6 % (ref 3.0–12.0)
Neutro Abs: 4.9 10*3/uL (ref 1.4–7.7)
Neutrophils Relative %: 61.5 % (ref 43.0–77.0)
Platelets: 227 10*3/uL (ref 150.0–400.0)
RBC: 4.41 Mil/uL (ref 3.87–5.11)
RDW: 14.5 % (ref 11.5–15.5)
WBC: 7.9 10*3/uL (ref 4.0–10.5)

## 2014-12-04 LAB — TSH: TSH: 1.44 u[IU]/mL (ref 0.35–4.50)

## 2014-12-04 LAB — LIPID PANEL
Cholesterol: 154 mg/dL (ref 0–200)
HDL: 35.6 mg/dL — ABNORMAL LOW (ref >39.00)
LDL Cholesterol: 95 mg/dL (ref 0–99)
NonHDL: 118.4
Total CHOL/HDL Ratio: 4
Triglycerides: 118 mg/dL (ref 0.0–149.0)
VLDL: 23.6 mg/dL (ref 0.0–40.0)

## 2014-12-04 LAB — HEPATIC FUNCTION PANEL
ALT: 15 U/L (ref 0–35)
AST: 21 U/L (ref 0–37)
Albumin: 3.8 g/dL (ref 3.5–5.2)
Alkaline Phosphatase: 44 U/L (ref 39–117)
Bilirubin, Direct: 0 mg/dL (ref 0.0–0.3)
Total Bilirubin: 0.4 mg/dL (ref 0.2–1.2)
Total Protein: 6.6 g/dL (ref 6.0–8.3)

## 2014-12-04 LAB — BASIC METABOLIC PANEL
BUN: 18 mg/dL (ref 6–23)
CO2: 25 mEq/L (ref 19–32)
Calcium: 8.8 mg/dL (ref 8.4–10.5)
Chloride: 106 mEq/L (ref 96–112)
Creatinine, Ser: 0.7 mg/dL (ref 0.4–1.2)
GFR: 98.04 mL/min (ref >60.00)
Glucose, Bld: 82 mg/dL (ref 70–99)
Potassium: 3.7 mEq/L (ref 3.5–5.1)
Sodium: 136 mEq/L (ref 135–145)

## 2014-12-05 ENCOUNTER — Encounter: Payer: Self-pay | Admitting: General Practice

## 2014-12-07 ENCOUNTER — Telehealth: Payer: Self-pay | Admitting: *Deleted

## 2014-12-07 NOTE — Telephone Encounter (Signed)
Medical records received via fax from [facility]. Forwarded to Dr. Birdie Riddle. JG//CMA

## 2015-01-14 ENCOUNTER — Ambulatory Visit: Payer: 59 | Admitting: Family Medicine

## 2015-01-15 ENCOUNTER — Encounter: Payer: Self-pay | Admitting: Family Medicine

## 2015-01-15 ENCOUNTER — Ambulatory Visit (INDEPENDENT_AMBULATORY_CARE_PROVIDER_SITE_OTHER): Payer: 59 | Admitting: Family Medicine

## 2015-01-15 VITALS — BP 112/78 | HR 97 | Temp 98.2°F | Resp 16 | Wt 192.0 lb

## 2015-01-15 DIAGNOSIS — E669 Obesity, unspecified: Secondary | ICD-10-CM

## 2015-01-15 NOTE — Patient Instructions (Signed)
Schedule your complete physical in 6 months Keep up the good work!  You look great! Call with any questions or concerns Happy [place]!

## 2015-01-15 NOTE — Assessment & Plan Note (Signed)
Pt looks good.  Losing weight and appears much more toned.  No side effects from phentermine so will continue at this time.  Will follow.

## 2015-01-15 NOTE — Progress Notes (Signed)
   Subjective:    Patient ID: Lori Zavala, female    DOB: 1976-08-28, 39 y.o.   MRN: [id number]  HPI Obesity- pt was started on phentermine at last visit.  Has lost more than 6 lbs.  Denies CP, SOB, HAs, visual changes, edema, palpitations, insomnia.  Pt has been exercising at least 4 days/week and following diet plan as outlined by personal trainer.     Review of Systems For ROS see HPI     Objective:   Physical Exam  Constitutional: She is oriented to person, place, and time. She appears well-developed and well-nourished. No distress.  HENT:  Head: Normocephalic and atraumatic.  Eyes: Conjunctivae and EOM are normal. Pupils are equal, round, and reactive to light.  Neck: Normal range of motion. Neck supple. No thyromegaly present.  Cardiovascular: Normal rate, regular rhythm, normal heart sounds and intact distal pulses.   No murmur heard. Pulmonary/Chest: Effort normal and breath sounds normal. No respiratory distress.  Abdominal: Soft. She exhibits no distension. There is no tenderness.  Musculoskeletal: She exhibits no edema.  Lymphadenopathy:    She has no cervical adenopathy.  Neurological: She is alert and oriented to person, place, and time.  Skin: Skin is warm and dry.  Psychiatric: She has a normal mood and affect. Her behavior is normal.  Vitals reviewed.         Assessment & Plan:

## 2015-01-15 NOTE — Progress Notes (Signed)
Pre visit review using our clinic review tool, if applicable. No additional management support is needed unless otherwise documented below in the visit note. 

## 2015-02-13 ENCOUNTER — Encounter: Payer: Self-pay | Admitting: Physician Assistant

## 2015-02-13 ENCOUNTER — Ambulatory Visit (INDEPENDENT_AMBULATORY_CARE_PROVIDER_SITE_OTHER): Payer: 59 | Admitting: Physician Assistant

## 2015-02-13 ENCOUNTER — Telehealth: Payer: Self-pay | Admitting: Family Medicine

## 2015-02-13 VITALS — BP 128/80 | HR 80 | Temp 98.1°F | Resp 16 | Wt 185.2 lb

## 2015-02-13 DIAGNOSIS — J0100 Acute maxillary sinusitis, unspecified: Secondary | ICD-10-CM | POA: Diagnosis not present

## 2015-02-13 DIAGNOSIS — J322 Chronic ethmoidal sinusitis: Secondary | ICD-10-CM | POA: Insufficient documentation

## 2015-02-13 MED ORDER — FLUTICASONE PROPIONATE 50 MCG/ACT NA SUSP: 2.0000 | Freq: Every day | NASAL | Status: DC

## 2015-02-13 MED ORDER — DOXYCYCLINE HYCLATE 100 MG PO CAPS: 100.0000 mg | ORAL_CAPSULE | Freq: Two times a day (BID) | ORAL | Status: DC

## 2015-02-13 MED ORDER — BENZONATATE 100 MG PO CAPS: 100.0000 mg | ORAL_CAPSULE | Freq: Three times a day (TID) | ORAL | Status: DC | PRN

## 2015-02-13 NOTE — Patient Instructions (Signed)
Please take antibiotic as directed.  Increase fluid intake.  Use Saline nasal spray.  Take a daily multivitamin. Use Tessalon and Flonase as directed.  Place a humidifier in the bedroom.  Please call or return clinic if symptoms are not improving.  Sinusitis Sinusitis is redness, soreness, and swelling (inflammation) of the paranasal sinuses. Paranasal sinuses are air pockets within the bones of your face (beneath the eyes, the middle of the forehead, or above the eyes). In healthy paranasal sinuses, mucus is able to drain out, and air is able to circulate through them by way of your nose. However, when your paranasal sinuses are inflamed, mucus and air can become trapped. This can allow bacteria and other germs to grow and cause infection. Sinusitis can develop quickly and last only a short time (acute) or continue over a long period (chronic). Sinusitis that lasts for more than 12 weeks is considered chronic.  CAUSES  Causes of sinusitis include:  Allergies.  Structural abnormalities, such as displacement of the cartilage that separates your nostrils (deviated septum), which can decrease the air flow through your nose and sinuses and affect sinus drainage.  Functional abnormalities, such as when the small hairs (cilia) that line your sinuses and help remove mucus do not work properly or are not present. SYMPTOMS  Symptoms of acute and chronic sinusitis are the same. The primary symptoms are pain and pressure around the affected sinuses. Other symptoms include:  Upper toothache.  Earache.  Headache.  Bad breath.  Decreased sense of smell and taste.  A cough, which worsens when you are lying flat.  Fatigue.  Fever.  Thick drainage from your nose, which often is green and may contain pus (purulent).  Swelling and warmth over the affected sinuses. DIAGNOSIS  Your caregiver will perform a physical exam. During the exam, your caregiver may:  Look in your nose for signs of abnormal  growths in your nostrils (nasal polyps).  Tap over the affected sinus to check for signs of infection.  View the inside of your sinuses (endoscopy) with a special imaging device with a light attached (endoscope), which is inserted into your sinuses. If your caregiver suspects that you have chronic sinusitis, one or more of the following tests may be recommended:  Allergy tests.  Nasal culture A sample of mucus is taken from your nose and sent to a lab and screened for bacteria.  Nasal cytology A sample of mucus is taken from your nose and examined by your caregiver to determine if your sinusitis is related to an allergy. TREATMENT  Most cases of acute sinusitis are related to a viral infection and will resolve on their own within 10 days. Sometimes medicines are prescribed to help relieve symptoms (pain medicine, decongestants, nasal steroid sprays, or saline sprays).  However, for sinusitis related to a bacterial infection, your caregiver will prescribe antibiotic medicines. These are medicines that will help kill the bacteria causing the infection.  Rarely, sinusitis is caused by a fungal infection. In theses cases, your caregiver will prescribe antifungal medicine. For some cases of chronic sinusitis, surgery is needed. Generally, these are cases in which sinusitis recurs more than 3 times per year, despite other treatments. HOME CARE INSTRUCTIONS   Drink plenty of water. Water helps thin the mucus so your sinuses can drain more easily.  Use a humidifier.  Inhale steam 3 to 4 times a day (for example, sit in the bathroom with the shower running).  Apply a warm, moist washcloth to your face  3 to 4 times a day, or as directed by your caregiver.  Use saline nasal sprays to help moisten and clean your sinuses.  Take over-the-counter or prescription medicines for pain, discomfort, or fever only as directed by your caregiver. SEEK IMMEDIATE MEDICAL CARE IF:  You have increasing pain or  severe headaches.  You have nausea, vomiting, or drowsiness.  You have swelling around your face.  You have vision problems.  You have a stiff neck.  You have difficulty breathing. MAKE SURE YOU:   Understand these instructions.  Will watch your condition.  Will get help right away if you are not doing well or get worse. Document Released: 11/09/2005 Document Revised: 02/01/2012 Document Reviewed: 11/24/2011 ExitCare Patient Information 2014 [employer].   

## 2015-02-13 NOTE — Progress Notes (Signed)
Pre visit review using our clinic review tool, if applicable. No additional management support is needed unless otherwise documented below in the visit note. 

## 2015-02-13 NOTE — Progress Notes (Signed)
   History of Present Illness: Lori Zavala is a 39 y.o. female who present to the clinic today complaining of sinus pressure, sinus pain and nasal congestion over the past week. Patient endorses significant facial pain.  Is having and nonproductive cough. Patient denies chest pain or shortness of breath.  Has taken Mucinex for symptoms with little relief.  History: Past Medical History  Diagnosis Date  . Frequent headaches     Current outpatient prescriptions:  .  phentermine 37.5 MG capsule, Take 1 capsule (37.5 mg total) by mouth every morning., Disp: 30 capsule, Rfl: 2 .  benzonatate (TESSALON) 100 MG capsule, Take 1 capsule (100 mg total) by mouth 3 (three) times daily as needed for cough., Disp: 30 capsule, Rfl: 0 .  doxycycline (VIBRAMYCIN) 100 MG capsule, Take 1 capsule (100 mg total) by mouth 2 (two) times daily., Disp: 20 capsule, Rfl: 0 No Known Allergies Family History  Problem Relation Age of Onset  . Alcohol abuse Father   . Cancer Father     prostate  . Hyperlipidemia Father   . Stroke Father   . Hypertension Father   . Kidney disease Father   . Mental illness Father   . Diabetes Father   . Cancer Maternal Grandmother     breast  . Cancer Paternal Grandmother     breast   History   Social History  . Marital Status: Married    Spouse Name: N/A  . Number of Children: N/A  . Years of Education: N/A   Social History Main Topics  . Smoking status: Never Smoker   . Smokeless tobacco: Not on file  . Alcohol Use: Yes  . Drug Use: No  . Sexual Activity: Yes   Other Topics Concern  . None   Social History Narrative    Review of Systems: See HPI.  All other ROS are negative.  Physical Examination: BP 128/80 mmHg  Pulse 80  Temp(Src) 98.1 F (36.7 C) (Oral)  Resp 16  Wt 185 lb 4 oz (84.029 kg)  SpO2 96%  General appearance: alert, cooperative and appears stated age Head: Normocephalic, without obvious abnormality, atraumatic, sinuses tender to  percussion Eyes: conjunctivae/corneas clear. PERRL, EOM's intact. Fundi benign. Ears: normal TM's and external ear canals both ears Nose: moderate congestion, turbinates swollen, sinus tenderness bilateral Throat: lips, mucosa, and tongue normal; teeth and gums normal Neck: no adenopathy, no carotid bruit, no JVD, supple, symmetrical, trachea midline and thyroid not enlarged, symmetric, no tenderness/mass/nodules Lungs: clear to auscultation bilaterally Chest wall: no tenderness  Assessment/Plan: Acute maxillary sinusitis Rx doxycycline.  Increase fluids.  Rest.  Saline nasal spray.  Probiotic.  Mucinex as directed.  Humidifier in bedroom. Lavella Lemons for cough  Call or return to clinic if symptoms are not improving.

## 2015-02-13 NOTE — Telephone Encounter (Signed)
Rx sent 

## 2015-02-13 NOTE — Telephone Encounter (Signed)
Caller name: Laetitia Relation to pt: self Call back number: [phone] Pharmacy[facility] [place]  Reason for call:   Patient thought that she was going to be prescribed a nasal spray at todays visit. Pharmacy did not receive rx

## 2015-02-13 NOTE — Assessment & Plan Note (Signed)
Rx doxycycline.  Increase fluids.  Rest.  Saline nasal spray.  Probiotic.  Mucinex as directed.  Humidifier in bedroom. Lavella Lemons for cough  Call or return to clinic if symptoms are not improving.

## 2015-03-05 ENCOUNTER — Other Ambulatory Visit: Payer: Self-pay | Admitting: Family Medicine

## 2015-03-05 NOTE — Telephone Encounter (Signed)
Med filled and faxed.  

## 2015-03-05 NOTE — Telephone Encounter (Signed)
Last OV 01/15/15 Phentermine last filled 12-03-14 #30 with 2

## 2015-03-14 ENCOUNTER — Ambulatory Visit (INDEPENDENT_AMBULATORY_CARE_PROVIDER_SITE_OTHER): Payer: 59 | Admitting: Psychology

## 2015-03-14 DIAGNOSIS — F348 Other persistent mood [affective] disorders: Secondary | ICD-10-CM | POA: Diagnosis not present

## 2015-04-03 ENCOUNTER — Telehealth: Payer: Self-pay | Admitting: *Deleted

## 2015-04-03 NOTE — Telephone Encounter (Signed)
Signed medical record request received via fax from [facility] requesting all medical records. Forwarded to [place] to email/fax to medical records. JG//CMA

## 2015-04-11 ENCOUNTER — Ambulatory Visit (INDEPENDENT_AMBULATORY_CARE_PROVIDER_SITE_OTHER): Payer: 59 | Admitting: Psychology

## 2015-04-11 DIAGNOSIS — F348 Other persistent mood [affective] disorders: Secondary | ICD-10-CM | POA: Diagnosis not present

## 2015-04-17 ENCOUNTER — Telehealth: Payer: Self-pay | Admitting: Family Medicine

## 2015-04-17 DIAGNOSIS — S99912S Unspecified injury of left ankle, sequela: Secondary | ICD-10-CM

## 2015-04-17 NOTE — Telephone Encounter (Signed)
Pt called indicating that she would like a 2nd opinion regarding her L ankle injury sustained on 11/15/14.  Pt is requesting Dr Bearl Mulberry at [facility] b/c he is a foot/ankle specialist

## 2015-05-09 ENCOUNTER — Ambulatory Visit (INDEPENDENT_AMBULATORY_CARE_PROVIDER_SITE_OTHER): Payer: 59 | Admitting: Psychology

## 2015-05-09 DIAGNOSIS — F348 Other persistent mood [affective] disorders: Secondary | ICD-10-CM | POA: Diagnosis not present

## 2015-05-19 LAB — HM PAP SMEAR

## 2015-06-05 ENCOUNTER — Ambulatory Visit (INDEPENDENT_AMBULATORY_CARE_PROVIDER_SITE_OTHER): Payer: 59 | Admitting: Psychology

## 2015-06-05 DIAGNOSIS — F348 Other persistent mood [affective] disorders: Secondary | ICD-10-CM

## 2015-06-19 ENCOUNTER — Other Ambulatory Visit: Payer: Self-pay | Admitting: Family Medicine

## 2015-06-19 NOTE — Telephone Encounter (Signed)
Rx faxed to [facility] pharmacy.  

## 2015-06-19 NOTE — Telephone Encounter (Signed)
Pt is requesting refill on Phentermine.  Last OV: 01/15/2015 Last Fill: 03/05/2015 #30 2RF  Please advise.

## 2015-06-19 NOTE — Telephone Encounter (Signed)
[place] for #30, no refills since this is not a maintenance medication

## 2015-06-20 ENCOUNTER — Encounter: Payer: 59 | Admitting: Family Medicine

## 2015-07-11 ENCOUNTER — Ambulatory Visit (INDEPENDENT_AMBULATORY_CARE_PROVIDER_SITE_OTHER): Payer: 59 | Admitting: Psychology

## 2015-07-11 DIAGNOSIS — F348 Other persistent mood [affective] disorders: Secondary | ICD-10-CM

## 2015-07-15 ENCOUNTER — Telehealth: Payer: Self-pay | Admitting: Behavioral Health

## 2015-07-15 ENCOUNTER — Encounter: Payer: 59 | Admitting: Family Medicine

## 2015-07-15 ENCOUNTER — Encounter: Payer: Self-pay | Admitting: Behavioral Health

## 2015-07-15 NOTE — Telephone Encounter (Signed)
Pre-Visit Call completed with patient and chart updated.   Pre-Visit Info documented in Specialty Comments under [employer].    

## 2015-07-16 ENCOUNTER — Encounter: Payer: Self-pay | Admitting: Family Medicine

## 2015-07-16 ENCOUNTER — Ambulatory Visit (INDEPENDENT_AMBULATORY_CARE_PROVIDER_SITE_OTHER): Payer: 59 | Admitting: Family Medicine

## 2015-07-16 VITALS — BP 122/84 | HR 79 | Temp 98.0°F | Resp 16 | Ht 63.0 in | Wt 176.5 lb

## 2015-07-16 DIAGNOSIS — Z23 Encounter for immunization: Secondary | ICD-10-CM | POA: Diagnosis not present

## 2015-07-16 DIAGNOSIS — Z01419 Encounter for gynecological examination (general) (routine) without abnormal findings: Secondary | ICD-10-CM

## 2015-07-16 DIAGNOSIS — Z0000 Encounter for general adult medical examination without abnormal findings: Secondary | ICD-10-CM | POA: Diagnosis not present

## 2015-07-16 LAB — CBC WITH DIFFERENTIAL/PLATELET
Basophils Absolute: 0 10*3/uL (ref 0.0–0.1)
Basophils Relative: 0.4 % (ref 0.0–3.0)
Eosinophils Absolute: 0.1 10*3/uL (ref 0.0–0.7)
Eosinophils Relative: 1.5 % (ref 0.0–5.0)
HCT: 37.3 % (ref 36.0–46.0)
HEMOGLOBIN: 12.4 g/dL (ref 12.0–15.0)
Lymphocytes Relative: 42.5 % (ref 12.0–46.0)
Lymphs Abs: 2 10*3/uL (ref 0.7–4.0)
MCHC: 33.3 g/dL (ref 30.0–36.0)
MCV: 88.9 fl (ref 78.0–100.0)
MONOS PCT: 6.8 % (ref 3.0–12.0)
Monocytes Absolute: 0.3 10*3/uL (ref 0.1–1.0)
Neutro Abs: 2.3 10*3/uL (ref 1.4–7.7)
Neutrophils Relative %: 48.8 % (ref 43.0–77.0)
Platelets: 218 10*3/uL (ref 150.0–400.0)
RBC: 4.2 Mil/uL (ref 3.87–5.11)
RDW: 13.1 % (ref 11.5–15.5)
WBC: 4.8 10*3/uL (ref 4.0–10.5)

## 2015-07-16 LAB — HEPATIC FUNCTION PANEL
ALT: 17 U/L (ref 0–35)
AST: 18 U/L (ref 0–37)
Albumin: 4.1 g/dL (ref 3.5–5.2)
Alkaline Phosphatase: 48 U/L (ref 39–117)
BILIRUBIN TOTAL: 0.6 mg/dL (ref 0.2–1.2)
Bilirubin, Direct: 0.1 mg/dL (ref 0.0–0.3)
Total Protein: 6.7 g/dL (ref 6.0–8.3)

## 2015-07-16 LAB — VITAMIN D 25 HYDROXY (VIT D DEFICIENCY, FRACTURES): VITD: 46.15 ng/mL (ref 30.00–100.00)

## 2015-07-16 LAB — LIPID PANEL
CHOL/HDL RATIO: 3
Cholesterol: 149 mg/dL (ref 0–200)
HDL: 49.3 mg/dL (ref >39.00)
LDL Cholesterol: 88 mg/dL (ref 0–99)
NonHDL: 99.79
Triglycerides: 61 mg/dL (ref 0.0–149.0)
VLDL: 12.2 mg/dL (ref 0.0–40.0)

## 2015-07-16 LAB — BASIC METABOLIC PANEL
BUN: 14 mg/dL (ref 6–23)
CO2: 30 mEq/L (ref 19–32)
Calcium: 9.6 mg/dL (ref 8.4–10.5)
Chloride: 104 mEq/L (ref 96–112)
Creatinine, Ser: 0.77 mg/dL (ref 0.40–1.20)
GFR: 88.99 mL/min (ref >60.00)
Glucose, Bld: 89 mg/dL (ref 70–99)
Potassium: 4.6 mEq/L (ref 3.5–5.1)
Sodium: 139 mEq/L (ref 135–145)

## 2015-07-16 LAB — TSH: TSH: 2.41 u[IU]/mL (ref 0.35–4.50)

## 2015-07-16 NOTE — Assessment & Plan Note (Signed)
Pt's PE WNL.  Due for GYN exam- referral placed.  Check labs.  Encouraged her to continue healthy diet, regular exercise.  Anticipatory guidance provided.

## 2015-07-16 NOTE — Patient Instructions (Signed)
Follow up in 1 year or as needed We'll notify you of your lab results and make any changes if needed Keep up the good work on healthy diet and regular exercise- you look great!! We'll call you with your GYN appt Call with any questions or concerns Good Luck with Back to School!!

## 2015-07-16 NOTE — Progress Notes (Signed)
Pre visit review using our clinic review tool, if applicable. No additional management support is needed unless otherwise documented below in the visit note. 

## 2015-07-16 NOTE — Progress Notes (Signed)
   Subjective:    Patient ID: Lori Zavala, female    DOB: Dec 01, 1975, 39 y.o.   MRN: [id number]  HPI CPE- pt is exercising regularly, working out w/ Physiological scientist and eating much better.  Due for GYN.   Review of Systems Patient reports no vision/ hearing changes, adenopathy,fever, weight change,  persistant/recurrent hoarseness , swallowing issues, chest pain, palpitations, edema, persistant/recurrent cough, hemoptysis, dyspnea (rest/exertional/paroxysmal nocturnal), gastrointestinal bleeding (melena, rectal bleeding), abdominal pain, significant heartburn, bowel changes, GU symptoms (dysuria, hematuria, incontinence), Gyn symptoms (abnormal  bleeding, pain),  syncope, focal weakness, memory loss, numbness & tingling, skin/hair/nail changes, abnormal bruising or bleeding, anxiety, or depression.     Objective:   Physical Exam General Appearance:    Alert, cooperative, no distress, appears stated age  Head:    Normocephalic, without obvious abnormality, atraumatic  Eyes:    PERRL, conjunctiva/corneas clear, EOM's intact, fundi    benign, both eyes  Ears:    Normal TM's and external ear canals, both ears  Nose:   Nares normal, septum midline, mucosa normal, no drainage    or sinus tenderness  Throat:   Lips, mucosa, and tongue normal; teeth and gums normal  Neck:   Supple, symmetrical, trachea midline, no adenopathy;    Thyroid: no enlargement/tenderness/nodules  Back:     Symmetric, no curvature, ROM normal, no CVA tenderness  Lungs:     Clear to auscultation bilaterally, respirations unlabored  Chest Wall:    No tenderness or deformity   Heart:    Regular rate and rhythm, S1 and S2 normal, no murmur, rub   or gallop  Breast Exam:    Deferred to GYN  Abdomen:     Soft, non-tender, bowel sounds active all four quadrants,    no masses, no organomegaly  Genitalia:    Deferred to GYN  Rectal:    Extremities:   Extremities normal, atraumatic, no cyanosis or edema  Pulses:   2+ and  symmetric all extremities  Skin:   Skin color, texture, turgor normal, no rashes or lesions  Lymph nodes:   Cervical, supraclavicular, and axillary nodes normal  Neurologic:   CNII-XII intact, normal strength, sensation and reflexes    throughout          Assessment & Plan:

## 2015-07-17 ENCOUNTER — Encounter: Payer: Self-pay | Admitting: General Practice

## 2015-07-24 ENCOUNTER — Other Ambulatory Visit: Payer: Self-pay | Admitting: Family Medicine

## 2015-07-24 NOTE — Telephone Encounter (Signed)
Last OV 07/16/15 Phentermine last filled 7/27 #30 with 0

## 2015-07-24 NOTE — Telephone Encounter (Signed)
Medication filled to pharmacy as requested.   

## 2015-08-08 ENCOUNTER — Ambulatory Visit (INDEPENDENT_AMBULATORY_CARE_PROVIDER_SITE_OTHER): Payer: 59 | Admitting: Psychology

## 2015-08-08 DIAGNOSIS — F348 Other persistent mood [affective] disorders: Secondary | ICD-10-CM | POA: Diagnosis not present

## 2015-09-05 ENCOUNTER — Ambulatory Visit: Payer: 59 | Admitting: Psychology

## 2015-09-20 ENCOUNTER — Ambulatory Visit (INDEPENDENT_AMBULATORY_CARE_PROVIDER_SITE_OTHER): Payer: 59 | Admitting: Psychology

## 2015-09-20 DIAGNOSIS — F3481 Disruptive mood dysregulation disorder: Secondary | ICD-10-CM | POA: Diagnosis not present

## 2015-09-25 ENCOUNTER — Other Ambulatory Visit: Payer: Self-pay | Admitting: Family Medicine

## 2015-09-25 NOTE — Telephone Encounter (Signed)
Medication filled to pharmacy as requested.   

## 2015-09-25 NOTE — Telephone Encounter (Signed)
Last Ov 07/16/15 Phentermine last filled 07/24/15 #30 with 1

## 2015-10-16 ENCOUNTER — Ambulatory Visit (INDEPENDENT_AMBULATORY_CARE_PROVIDER_SITE_OTHER): Payer: 59 | Admitting: Psychology

## 2015-10-16 DIAGNOSIS — F3489 Other specified persistent mood disorders: Secondary | ICD-10-CM

## 2015-10-28 ENCOUNTER — Other Ambulatory Visit: Payer: Self-pay | Admitting: Family Medicine

## 2015-10-28 NOTE — Telephone Encounter (Signed)
Medication filled to pharmacy as requested.   

## 2015-10-28 NOTE — Telephone Encounter (Signed)
Last refill for 3-6 months as pt needs a wash out period before metabolism is dependent on medication

## 2015-10-28 NOTE — Telephone Encounter (Signed)
Last OV 07-16-15 Phentermine last filled 09/25/15 #30 with 0

## 2015-11-01 ENCOUNTER — Ambulatory Visit (INDEPENDENT_AMBULATORY_CARE_PROVIDER_SITE_OTHER): Payer: 59 | Admitting: Family Medicine

## 2015-11-01 ENCOUNTER — Encounter: Payer: Self-pay | Admitting: Family Medicine

## 2015-11-01 VITALS — BP 120/80 | HR 98 | Temp 98.2°F | Resp 16 | Ht 63.0 in | Wt 172.0 lb

## 2015-11-01 DIAGNOSIS — H00019 Hordeolum externum unspecified eye, unspecified eyelid: Secondary | ICD-10-CM | POA: Insufficient documentation

## 2015-11-01 DIAGNOSIS — H00013 Hordeolum externum right eye, unspecified eyelid: Secondary | ICD-10-CM

## 2015-11-01 NOTE — Patient Instructions (Signed)
Follow up as needed This is a stye Apply hot compresses DO NOT PICK Call with any questions or concerns Hang in there!

## 2015-11-01 NOTE — Progress Notes (Signed)
   Subjective:    Patient ID: Lori Zavala, female    DOB: [birth year], 39 y.o.   MRN: [id number]  HPI R lower lid pain and swelling- pt woke this AM w/ sxs.  No hx of similar.  Not recently rubbing eye.  No change in makeup.  TTP.   Review of Systems For ROS see HPI     Objective:   Physical Exam  Constitutional: She is oriented to person, place, and time. She appears well-developed and well-nourished. No distress.  HENT:  Head: Normocephalic and atraumatic.  Eyes: Conjunctivae and EOM are normal. Pupils are equal, round, and reactive to light.  R lower lid w/ mild erythema and evidence of small sty- no overlying cellulitis  Neurological: She is alert and oriented to person, place, and time.  Skin: Skin is warm. There is erythema (overlying R lower eyelid).  Psychiatric: She has a normal mood and affect. Her behavior is normal. Thought content normal.  Vitals reviewed.         Assessment & Plan:

## 2015-11-01 NOTE — Assessment & Plan Note (Signed)
New.  Reviewed dx and supportive care w/ pt.  No need for abx as there is no overlying infection or evidence of cellulitis.  Hot compresses for symptomatic relief.  Reviewed supportive care and red flags that should prompt return.  Pt expressed understanding and is in agreement w/ plan.

## 2015-11-01 NOTE — Progress Notes (Signed)
Pre visit review using our clinic review tool, if applicable. No additional management support is needed unless otherwise documented below in the visit note. 

## 2015-11-13 ENCOUNTER — Ambulatory Visit (INDEPENDENT_AMBULATORY_CARE_PROVIDER_SITE_OTHER): Payer: 59 | Admitting: Psychology

## 2015-11-13 DIAGNOSIS — F3489 Other specified persistent mood disorders: Secondary | ICD-10-CM | POA: Diagnosis not present

## 2015-11-28 ENCOUNTER — Encounter: Payer: Self-pay | Admitting: Family Medicine

## 2015-11-28 ENCOUNTER — Ambulatory Visit (INDEPENDENT_AMBULATORY_CARE_PROVIDER_SITE_OTHER): Payer: 59 | Admitting: Family Medicine

## 2015-11-28 VITALS — BP 116/77 | HR 88 | Temp 98.7°F | Resp 16 | Ht 63.0 in | Wt 171.0 lb

## 2015-11-28 DIAGNOSIS — J0100 Acute maxillary sinusitis, unspecified: Secondary | ICD-10-CM

## 2015-11-28 MED ORDER — AMOXICILLIN 875 MG PO TABS: 875.0000 mg | ORAL_TABLET | Freq: Two times a day (BID) | ORAL | Status: DC

## 2015-11-28 NOTE — Progress Notes (Signed)
   Subjective:    Patient ID: Lori Zavala, female    DOB: 12/13/1975, 40 y.o.   MRN: [id number]  HPI URI- sxs started as cold 2 weeks ago.  Pt has hx of enlarged turbinates which frequently leads to sinus infection.  + tooth pain.  Painful to lean forward due to sinus pressure.  No dizziness.  No ear pain.  No cough.  No fever.  + sick contacts.  No N/V.   Review of Systems For ROS see HPI     Objective:   Physical Exam  Constitutional: She appears well-developed and well-nourished. No distress.  HENT:  Head: Normocephalic and atraumatic.  Right Ear: Tympanic membrane normal.  Left Ear: Tympanic membrane normal.  Nose: Mucosal edema and rhinorrhea present. Right sinus exhibits maxillary sinus tenderness. Right sinus exhibits no frontal sinus tenderness. Left sinus exhibits maxillary sinus tenderness. Left sinus exhibits no frontal sinus tenderness.  Mouth/Throat: Uvula is midline and mucous membranes are normal. Posterior oropharyngeal erythema present. No oropharyngeal exudate.  Eyes: Conjunctivae and EOM are normal. Pupils are equal, round, and reactive to light.  Neck: Normal range of motion. Neck supple.  Cardiovascular: Normal rate, regular rhythm and normal heart sounds.   Pulmonary/Chest: Effort normal and breath sounds normal. No respiratory distress. She has no wheezes.  Lymphadenopathy:    She has no cervical adenopathy.  Vitals reviewed.         Assessment & Plan:

## 2015-11-28 NOTE — Patient Instructions (Signed)
Follow up as needed Start the Amoxicillin twice daily- take w/ food Drink plenty of fluids Alternate tylenol/ibuprofen for pain relief Call with any questions or concerns Hang in there!!!

## 2015-11-28 NOTE — Progress Notes (Signed)
Pre visit review using our clinic review tool, if applicable. No additional management support is needed unless otherwise documented below in the visit note. 

## 2015-11-28 NOTE — Assessment & Plan Note (Signed)
Pt's sxs and PE consistent w/ infxn.  Start abx.  Reviewed supportive care and red flags that should prompt return.  Pt expressed understanding and is in agreement w/ plan.  

## 2015-12-11 ENCOUNTER — Ambulatory Visit (INDEPENDENT_AMBULATORY_CARE_PROVIDER_SITE_OTHER): Payer: 59 | Admitting: Psychology

## 2015-12-11 DIAGNOSIS — F3481 Disruptive mood dysregulation disorder: Secondary | ICD-10-CM | POA: Diagnosis not present

## 2015-12-31 ENCOUNTER — Telehealth: Payer: Self-pay | Admitting: Family Medicine

## 2015-12-31 NOTE — Telephone Encounter (Signed)
Patient declined receiving flu shot  °

## 2016-01-06 ENCOUNTER — Telehealth: Payer: Self-pay | Admitting: Family Medicine

## 2016-01-06 MED ORDER — OSELTAMIVIR PHOSPHATE 75 MG PO CAPS: 75.0000 mg | ORAL_CAPSULE | Freq: Every day | ORAL | Status: DC

## 2016-01-06 MED ORDER — AMOXICILLIN 875 MG PO TABS: 875.0000 mg | ORAL_TABLET | Freq: Two times a day (BID) | ORAL | Status: DC

## 2016-01-06 MED ORDER — PROMETHAZINE-DM 6.25-15 MG/5ML PO SYRP: 5.0000 mL | ORAL_SOLUTION | Freq: Four times a day (QID) | ORAL | Status: DC | PRN

## 2016-01-06 NOTE — Telephone Encounter (Signed)
Pt called indicating that her daughter tested + for flu today.  Is asking for Tamiflu prophylaxis.  Pt has also been sick herself for over 2 weeks- cough that is not responding to albuterol inhaler, mucinex, cool mist humidifier- and she now has facial pain/pressure consistent w/ her previous sinus infections.  No appts available today.  Will send abx to pharmacy with the understanding that if she doesn't improve, she will need appt.  Pt expressed understanding and is in agreement w/ plan.

## 2016-02-11 ENCOUNTER — Ambulatory Visit: Payer: 59 | Admitting: Psychology

## 2016-02-17 ENCOUNTER — Ambulatory Visit (INDEPENDENT_AMBULATORY_CARE_PROVIDER_SITE_OTHER): Payer: 59 | Admitting: Family Medicine

## 2016-02-17 ENCOUNTER — Encounter: Payer: Self-pay | Admitting: Family Medicine

## 2016-02-17 VITALS — BP 119/80 | HR 76 | Temp 98.1°F | Resp 16 | Wt 177.0 lb

## 2016-02-17 DIAGNOSIS — F909 Attention-deficit hyperactivity disorder, unspecified type: Secondary | ICD-10-CM | POA: Insufficient documentation

## 2016-02-17 DIAGNOSIS — F902 Attention-deficit hyperactivity disorder, combined type: Secondary | ICD-10-CM

## 2016-02-17 MED ORDER — METHYLPHENIDATE HCL ER (OSM) 18 MG PO TBCR: 18.0000 mg | EXTENDED_RELEASE_TABLET | Freq: Every day | ORAL | Status: DC

## 2016-02-17 NOTE — Progress Notes (Signed)
Pre visit review using our clinic review tool, if applicable. No additional management support is needed unless otherwise documented below in the visit note. 

## 2016-02-17 NOTE — Patient Instructions (Signed)
Follow up in 1 month to recheck ADHD symptoms Start the Concerta daily Call with any questions or concerns Hang in there!!!

## 2016-02-17 NOTE — Assessment & Plan Note (Signed)
New.  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  Based on her response to phentermine in the past, will start low dose extended release concerta and monitor for improvement.  Reviewed possible side effects and importance of lifestyle modifications in addition to medication.  Pt expressed understanding and is in agreement w/ plan.

## 2016-02-17 NOTE — Progress Notes (Signed)
   Subjective:    Patient ID: SUNIYA HAUGHEY, female    DOB: 10-13-1976, 40 y.o.   MRN: [id number]  HPI ADHD- pt has hx of this but has been able to manage until recently when she added full time in addition to all of her other commitments.  Husband and son are both medicated for ADHD and this led pt to question her own sxs.  Pt having a hard time finishing tasks or goal driven behaviors w/o getting distracted by other tasks.  She does not have underlying anxiety.  Did notice that her focus improved while she was taking phentermine for weight loss.   Review of Systems For ROS see HPI     Objective:   Physical Exam  Constitutional: She is oriented to person, place, and time. She appears well-developed and well-nourished. No distress.  HENT:  Head: Normocephalic and atraumatic.  Cardiovascular: Normal rate, regular rhythm and normal heart sounds.   Pulmonary/Chest: Effort normal and breath sounds normal. No respiratory distress. She has no wheezes. She has no rales.  Neurological: She is alert and oriented to person, place, and time. No cranial nerve deficit. Coordination normal.  Skin: Skin is warm and dry.  Psychiatric: She has a normal mood and affect. Her behavior is normal. Thought content normal.  Vitals reviewed.         Assessment & Plan:

## 2016-02-20 ENCOUNTER — Telehealth: Payer: Self-pay | Admitting: General Practice

## 2016-02-20 ENCOUNTER — Ambulatory Visit (INDEPENDENT_AMBULATORY_CARE_PROVIDER_SITE_OTHER): Payer: 59 | Admitting: Psychology

## 2016-02-20 DIAGNOSIS — F3481 Disruptive mood dysregulation disorder: Secondary | ICD-10-CM | POA: Diagnosis not present

## 2016-02-20 MED ORDER — METHYLPHENIDATE HCL ER (XR) 10 MG PO CP24: 10.0000 mg | ORAL_CAPSULE | Freq: Every day | ORAL | Status: DC

## 2016-02-20 NOTE — Telephone Encounter (Signed)
Med filled and pt informed available at the front desk.

## 2016-02-20 NOTE — Telephone Encounter (Signed)
Ok for [employer] 10mg  daily x1 week and then increase to 2 tabs daily, #60

## 2016-02-20 NOTE — Telephone Encounter (Signed)
Pt called and advised that concerta is to expensive, is there a cheaper alternative?

## 2016-02-21 ENCOUNTER — Other Ambulatory Visit: Payer: Self-pay

## 2016-02-21 MED ORDER — METHYLPHENIDATE HCL ER (XR) 10 MG PO CP24: 10.0000 mg | ORAL_CAPSULE | Freq: Every day | ORAL | Status: DC

## 2016-02-24 ENCOUNTER — Telehealth: Payer: Self-pay

## 2016-02-24 NOTE — Telephone Encounter (Signed)
Called patient to advise that the medication Methylphenidate HCI ER 10 mg CP24 was approved and the pharmacy ([facility]) was notified of approval, no precert number available, as the authorization was done through "Cover my Meds"

## 2016-03-16 ENCOUNTER — Encounter: Payer: Self-pay | Admitting: Family Medicine

## 2016-03-16 ENCOUNTER — Ambulatory Visit (INDEPENDENT_AMBULATORY_CARE_PROVIDER_SITE_OTHER): Payer: 59 | Admitting: Family Medicine

## 2016-03-16 VITALS — BP 126/80 | HR 76 | Temp 98.1°F | Resp 16 | Wt 177.0 lb

## 2016-03-16 DIAGNOSIS — F902 Attention-deficit hyperactivity disorder, combined type: Secondary | ICD-10-CM

## 2016-03-16 MED ORDER — LISDEXAMFETAMINE DIMESYLATE 30 MG PO CAPS: 30.0000 mg | ORAL_CAPSULE | Freq: Every day | ORAL | Status: DC

## 2016-03-16 NOTE — Assessment & Plan Note (Signed)
Pt had no symptom improvement while on Aptensio so there is no point in continuing to titrate medication.  Will switch to Vyvanse and monitor for symptom improvement.  Pt expressed understanding and is in agreement w/ plan.

## 2016-03-16 NOTE — Progress Notes (Signed)
Pre visit review using our clinic review tool, if applicable. No additional management support is needed unless otherwise documented below in the visit note. 

## 2016-03-16 NOTE — Patient Instructions (Signed)
Follow up by phone or MyChart in 2-3 weeks to let me know how it's going Start the Vyvanse once daily in the morning STOP the Aptensio Call with any questions or concerns Hang in there!!!

## 2016-03-16 NOTE — Progress Notes (Signed)
   Subjective:    Patient ID: Lori Zavala, female    DOB: Jun 13, 1976, 40 y.o.   MRN: [id number]  HPI ADHD- pt started Aptensio at 10mg  but noticed no symptom improvement.  Neither did family or friends.  Pt increased dose at my request to 20mg , then 30mg , then 40mg  and still had no relief of sxs.  Based on this lack of response, pt returns today to discuss changing meds.  No palpitations, insomnia, elevated BP.   Review of Systems For ROS see HPI     Objective:   Physical Exam  Constitutional: She is oriented to person, place, and time. She appears well-developed and well-nourished. No distress.  HENT:  Head: Normocephalic and atraumatic.  Eyes: Conjunctivae and EOM are normal. Pupils are equal, round, and reactive to light.  Cardiovascular: Normal rate, regular rhythm and normal heart sounds.   Pulmonary/Chest: Effort normal and breath sounds normal. No respiratory distress. She has no wheezes. She has no rales.  Neurological: She is alert and oriented to person, place, and time.  Skin: Skin is warm and dry.  Psychiatric: She has a normal mood and affect. Her behavior is normal. Thought content normal.  Vitals reviewed.         Assessment & Plan:

## 2016-03-23 ENCOUNTER — Ambulatory Visit (INDEPENDENT_AMBULATORY_CARE_PROVIDER_SITE_OTHER): Payer: 59 | Admitting: Psychology

## 2016-03-23 DIAGNOSIS — F3481 Disruptive mood dysregulation disorder: Secondary | ICD-10-CM

## 2016-04-06 ENCOUNTER — Other Ambulatory Visit: Payer: Self-pay | Admitting: Family Medicine

## 2016-04-06 ENCOUNTER — Other Ambulatory Visit: Payer: Self-pay | Admitting: General Practice

## 2016-04-06 MED ORDER — LISDEXAMFETAMINE DIMESYLATE 60 MG PO CAPS: 60.0000 mg | ORAL_CAPSULE | ORAL | Status: DC

## 2016-04-06 NOTE — Telephone Encounter (Signed)
Since increasing vyvanse medication, pt needs a refill  Last OV 03/16/16 vyvanse last filled 03/16/16 #30 with 0

## 2016-04-06 NOTE — Progress Notes (Signed)
Pt reports 60mg  dose has been much more effective in controlling her sxs but b/c she was taking 2 daily, she is out of medication.  Refill provided.

## 2016-04-06 NOTE — Telephone Encounter (Signed)
Med filled and pt notified available at front desk.

## 2016-04-30 ENCOUNTER — Ambulatory Visit (INDEPENDENT_AMBULATORY_CARE_PROVIDER_SITE_OTHER): Payer: 59 | Admitting: Psychology

## 2016-04-30 DIAGNOSIS — F411 Generalized anxiety disorder: Secondary | ICD-10-CM

## 2016-05-05 ENCOUNTER — Other Ambulatory Visit: Payer: Self-pay | Admitting: Family Medicine

## 2016-05-05 NOTE — Telephone Encounter (Signed)
Last OV 03/16/16 Vyvanse last filled 04/06/16 #30 with 0  I asked pt how she was feeling on the new dose of vyvanse. She stated that it was working great she just feels that she has many new stressors with the kids home from school, and her job. I advised pt that normally we would complete a 4-6 week mood follow up with switching medications. Pt was advised that she would have to be on the look out for mood changes with any ADHD medication. Pt would like to be on meds for another 4 weeks and will try to monitor if change in mood is due to school being out or a bad side effect of the medication.

## 2016-05-05 NOTE — Telephone Encounter (Signed)
Pt needs refill on vyvanse.  °

## 2016-05-06 MED ORDER — LISDEXAMFETAMINE DIMESYLATE 60 MG PO CAPS: 60.0000 mg | ORAL_CAPSULE | ORAL | Status: DC

## 2016-05-06 NOTE — Telephone Encounter (Signed)
Med filled, pt informed available at front desk for pick up.  

## 2016-05-18 ENCOUNTER — Encounter: Payer: Self-pay | Admitting: Family Medicine

## 2016-05-18 ENCOUNTER — Ambulatory Visit (INDEPENDENT_AMBULATORY_CARE_PROVIDER_SITE_OTHER): Payer: 59 | Admitting: Family Medicine

## 2016-05-18 VITALS — BP 126/80 | HR 78 | Temp 98.0°F | Resp 16 | Ht 63.0 in | Wt 178.4 lb

## 2016-05-18 DIAGNOSIS — F902 Attention-deficit hyperactivity disorder, combined type: Secondary | ICD-10-CM

## 2016-05-18 MED ORDER — METHYLPHENIDATE HCL 5 MG PO TABS: ORAL_TABLET | ORAL | Status: DC

## 2016-05-18 NOTE — Assessment & Plan Note (Signed)
Pt's daytime sxs are much better since increasing Vyvanse.  She is now feeling that she is crashing off her Vyvanse between 4:30-5pm and this is worsening her sxs- racing thoughts, feeling out of control.  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  Reviewed supportive care and red flags that should prompt return.  Pt expressed understanding and is in agreement w/ plan.

## 2016-05-18 NOTE — Progress Notes (Signed)
Pre visit review using our clinic review tool, if applicable. No additional management support is needed unless otherwise documented below in the visit note. 

## 2016-05-18 NOTE — Patient Instructions (Signed)
Follow up by phone and let me know how the extra medication is working Start the low dose Ritalin at 4:30pm when the Vyvanse is wearing off Continue the Vyvanse daily Call with any questions or concerns Hang in there!!!  We'll figure this out!

## 2016-05-18 NOTE — Progress Notes (Signed)
   Subjective:    Patient ID: Lori Zavala, female    DOB: 07/30/1976, 40 y.o.   MRN: [id number]  HPI ADHD- pt reports 'i love it during the day' in regards to Vyvanse.  Able to concentrate and get things done during the day but 'my nights are very frustrating for me'.  Reports her ADHD is now condensed into the evenings and she is having a very notable vyvanse 'crash'.  Is having difficulty remembering things, is short tempered, unable to sleep.  She likes the way that Vyvanse manages her sxs during the day but doesn't know what to do at night.  Denies palpitations, anorexia.   Review of Systems For ROS see HPI     Objective:   Physical Exam  Constitutional: She is oriented to person, place, and time. She appears well-developed and well-nourished. No distress.  HENT:  Head: Normocephalic and atraumatic.  Cardiovascular: Normal rate, regular rhythm and normal heart sounds.   Pulmonary/Chest: Effort normal and breath sounds normal. No respiratory distress. She has no wheezes. She has no rales.  Neurological: She is alert and oriented to person, place, and time.  Skin: Skin is warm and dry.  Psychiatric: She has a normal mood and affect. Her behavior is normal. Thought content normal.  Vitals reviewed.         Assessment & Plan:

## 2016-06-04 ENCOUNTER — Other Ambulatory Visit: Payer: Self-pay | Admitting: Family Medicine

## 2016-06-04 MED ORDER — LISDEXAMFETAMINE DIMESYLATE 60 MG PO CAPS: 60.0000 mg | ORAL_CAPSULE | ORAL | Status: DC

## 2016-06-04 NOTE — Telephone Encounter (Signed)
Pt needs refill on VYVANSE

## 2016-06-04 NOTE — Telephone Encounter (Signed)
Last OV 6826/17 vyvanse last filled 05/06/16 #60 with 0

## 2016-06-04 NOTE — Telephone Encounter (Signed)
Med filled and pt informed available at front desk for pick up.  

## 2016-06-15 ENCOUNTER — Ambulatory Visit (INDEPENDENT_AMBULATORY_CARE_PROVIDER_SITE_OTHER): Payer: 59 | Admitting: Family Medicine

## 2016-06-15 ENCOUNTER — Encounter: Payer: Self-pay | Admitting: Family Medicine

## 2016-06-15 ENCOUNTER — Telehealth: Payer: Self-pay | Admitting: Family Medicine

## 2016-06-15 VITALS — BP 110/78 | HR 89 | Temp 98.8°F | Resp 16 | Wt 174.1 lb

## 2016-06-15 DIAGNOSIS — J3089 Other allergic rhinitis: Secondary | ICD-10-CM | POA: Diagnosis not present

## 2016-06-15 DIAGNOSIS — H6983 Other specified disorders of Eustachian tube, bilateral: Secondary | ICD-10-CM | POA: Diagnosis not present

## 2016-06-15 NOTE — Telephone Encounter (Signed)
Pt asking if there is anyway she could stop by for KT to look at her ears, Pt states that they feel stopped up. Also pt states that she is unable to get her voice back and not sure what she soul do

## 2016-06-15 NOTE — Patient Instructions (Signed)
Follow up w/ ENT as scheduled Start daily Claritin or Zyrtec (stronger) to improve nasal congestion and possibly hearing Call with any questions or concerns Hang in there!!!

## 2016-06-15 NOTE — Progress Notes (Signed)
   Subjective:    Patient ID: Lori Zavala, female    DOB: May 30, 1976, 40 y.o.   MRN: [id number]  HPI Ear fullness- pt states she 'can't hear'.  'i can't tell if it's wax or I have a hearing problem'.  Pt feels that's why she might be so loud.  + hoarseness.  Denies PND, nasal congestion.  Has appt upcoming w/ ENT.  Not taking daily antihistamine.   Review of Systems For ROS see HPI     Objective:   Physical Exam  Constitutional: She appears well-developed and well-nourished. No distress.  HENT:  Head: Normocephalic and atraumatic.  Right Ear: Tympanic membrane is retracted.  Left Ear: Tympanic membrane is retracted.  Nose: Mucosal edema and rhinorrhea present. Right sinus exhibits no maxillary sinus tenderness and no frontal sinus tenderness. Left sinus exhibits no maxillary sinus tenderness and no frontal sinus tenderness.  Mouth/Throat: Mucous membranes are normal. Posterior oropharyngeal erythema (w/ PND) present.  Eyes: Conjunctivae and EOM are normal. Pupils are equal, round, and reactive to light.  Neck: Normal range of motion. Neck supple.  Cardiovascular: Normal rate, regular rhythm and normal heart sounds.   Pulmonary/Chest: Effort normal and breath sounds normal. No respiratory distress. She has no wheezes. She has no rales.  Lymphadenopathy:    She has no cervical adenopathy.  Vitals reviewed.         Assessment & Plan:  Allergic Rhinitis/Eustachian Tube Dysfunction- new.  Encouraged pt to f/u w/ ENT as scheduled.  Stressed need for daily antihistamine although pt is resistant.  No need for abx- no evidence of infxn.  Reviewed supportive care and red flags that should prompt return.  Pt expressed understanding and is in agreement w/ plan.

## 2016-06-15 NOTE — Telephone Encounter (Signed)
You may need to have amanda put her on the schedule.

## 2016-06-15 NOTE — Telephone Encounter (Signed)
Pt has been scheduled.  °

## 2016-06-15 NOTE — Telephone Encounter (Signed)
If pt can be here by 4:15 we can schedule her and assess her sxs

## 2016-07-06 ENCOUNTER — Other Ambulatory Visit: Payer: Self-pay | Admitting: Family Medicine

## 2016-07-06 MED ORDER — LISDEXAMFETAMINE DIMESYLATE 60 MG PO CAPS: 60.0000 mg | ORAL_CAPSULE | ORAL | 0 refills | Status: DC

## 2016-07-06 NOTE — Telephone Encounter (Signed)
Med filled and pt informed.  

## 2016-07-06 NOTE — Telephone Encounter (Signed)
Patient calling to request refill of lisdexamfetamine (VYVANSE) 60 MG capsule.  Patient states she took her last pill yesterday.  Please call when rx is ready for pick up.

## 2016-07-06 NOTE — Telephone Encounter (Signed)
Last OV 06/15/16 vyvanse last filled 06/04/16 #30 with 0

## 2016-07-07 DIAGNOSIS — J324 Chronic pansinusitis: Secondary | ICD-10-CM | POA: Insufficient documentation

## 2016-07-07 DIAGNOSIS — J343 Hypertrophy of nasal turbinates: Secondary | ICD-10-CM | POA: Insufficient documentation

## 2016-07-07 DIAGNOSIS — J342 Deviated nasal septum: Secondary | ICD-10-CM | POA: Insufficient documentation

## 2016-07-07 DIAGNOSIS — H6993 Unspecified Eustachian tube disorder, bilateral: Secondary | ICD-10-CM | POA: Insufficient documentation

## 2016-07-07 DIAGNOSIS — J309 Allergic rhinitis, unspecified: Secondary | ICD-10-CM | POA: Insufficient documentation

## 2016-07-28 ENCOUNTER — Encounter: Payer: Self-pay | Admitting: Family Medicine

## 2016-08-04 ENCOUNTER — Other Ambulatory Visit: Payer: Self-pay | Admitting: Family Medicine

## 2016-08-04 NOTE — Telephone Encounter (Signed)
Pt left voicemail message requesting refill of lisdexamfetamine (VYVANSE) 60 MG capsule.  Please call when ready for pick up.

## 2016-08-05 ENCOUNTER — Telehealth: Payer: Self-pay | Admitting: Family Medicine

## 2016-08-05 MED ORDER — METHYLPHENIDATE HCL 5 MG PO TABS: ORAL_TABLET | ORAL | 0 refills | Status: DC

## 2016-08-05 MED ORDER — LISDEXAMFETAMINE DIMESYLATE 60 MG PO CAPS: 60.0000 mg | ORAL_CAPSULE | ORAL | 0 refills | Status: DC

## 2016-08-05 NOTE — Telephone Encounter (Signed)
Pt needs refill on methylphenidate, [facility] [place]

## 2016-08-05 NOTE — Telephone Encounter (Signed)
Last OV 06/15/16 vyvanse last filled 07/06/16 #30 with 0

## 2016-08-05 NOTE — Telephone Encounter (Signed)
Med filled and pt informed available for pick up.

## 2016-08-05 NOTE — Telephone Encounter (Signed)
Pt informed, rx available at front desk for pick up.  

## 2016-08-31 ENCOUNTER — Encounter: Payer: Self-pay | Admitting: Family Medicine

## 2016-08-31 ENCOUNTER — Ambulatory Visit (INDEPENDENT_AMBULATORY_CARE_PROVIDER_SITE_OTHER): Payer: 59 | Admitting: Family Medicine

## 2016-08-31 VITALS — BP 106/82 | HR 76 | Temp 98.9°F | Resp 16 | Ht 63.0 in | Wt 176.4 lb

## 2016-08-31 DIAGNOSIS — Z0000 Encounter for general adult medical examination without abnormal findings: Secondary | ICD-10-CM | POA: Diagnosis not present

## 2016-08-31 LAB — CBC WITH DIFFERENTIAL/PLATELET
BASOS PCT: 0 %
Basophils Absolute: 0 cells/uL (ref 0–200)
Eosinophils Absolute: 83 cells/uL (ref 15–500)
Eosinophils Relative: 1 %
HEMATOCRIT: 37.8 % (ref 35.0–45.0)
HEMOGLOBIN: 12.4 g/dL (ref 11.7–15.5)
LYMPHS ABS: 2241 {cells}/uL (ref 850–3900)
Lymphocytes Relative: 27 %
MCH: 29.2 pg (ref 27.0–33.0)
MCHC: 32.8 g/dL (ref 32.0–36.0)
MCV: 89.2 fL (ref 80.0–100.0)
MONO ABS: 498 {cells}/uL (ref 200–950)
MPV: 9.8 fL (ref 7.5–12.5)
Monocytes Relative: 6 %
NEUTROS ABS: 5478 {cells}/uL (ref 1500–7800)
Neutrophils Relative %: 66 %
Platelets: 251 10*3/uL (ref 140–400)
RBC: 4.24 MIL/uL (ref 3.80–5.10)
RDW: 12.7 % (ref 11.0–15.0)
WBC: 8.3 10*3/uL (ref 3.8–10.8)

## 2016-08-31 LAB — HEPATIC FUNCTION PANEL
ALBUMIN: 4 g/dL (ref 3.6–5.1)
ALT: 25 U/L (ref 6–29)
AST: 21 U/L (ref 10–30)
Alkaline Phosphatase: 45 U/L (ref 33–115)
BILIRUBIN TOTAL: 0.4 mg/dL (ref 0.2–1.2)
Bilirubin, Direct: 0.1 mg/dL (ref <=0.2)
Indirect Bilirubin: 0.3 mg/dL (ref 0.2–1.2)
Total Protein: 6.4 g/dL (ref 6.1–8.1)

## 2016-08-31 LAB — BASIC METABOLIC PANEL
BUN: 18 mg/dL (ref 7–25)
CO2: 24 mmol/L (ref 20–31)
CREATININE: 0.92 mg/dL (ref 0.50–1.10)
Calcium: 9.1 mg/dL (ref 8.6–10.2)
Chloride: 104 mmol/L (ref 98–110)
Glucose, Bld: 70 mg/dL (ref 65–99)
Potassium: 4.1 mmol/L (ref 3.5–5.3)
Sodium: 139 mmol/L (ref 135–146)

## 2016-08-31 LAB — LIPID PANEL
CHOL/HDL RATIO: 2.8 ratio (ref <=5.0)
CHOLESTEROL: 149 mg/dL (ref 125–200)
HDL: 53 mg/dL (ref >=46)
LDL Cholesterol: 69 mg/dL (ref <130)
TRIGLYCERIDES: 133 mg/dL (ref <150)
VLDL: 27 mg/dL (ref <30)

## 2016-08-31 LAB — TSH: TSH: 2.31 m[IU]/L

## 2016-08-31 NOTE — Patient Instructions (Addendum)
Follow up in 6 months to recheck ADHD We'll notify you of your lab results and make any changes if needed Keep up the good work on healthy diet and regular exercise- you look great!!! Call with any questions or concerns Happy Fall!!!

## 2016-08-31 NOTE — Progress Notes (Signed)
   Subjective:    Patient ID: Lori Zavala, female    DOB: 01-Aug-1976, 40 y.o.   MRN: [id number]  HPI CPE- UTD on pap, too young for mammo.  Declines flu shot.  UTD on Tdap.  No concerns today.   Review of Systems Patient reports no vision/ hearing changes, adenopathy,fever, weight change,  persistant/recurrent hoarseness , swallowing issues, chest pain, palpitations, edema, persistant/recurrent cough, hemoptysis, dyspnea (rest/exertional/paroxysmal nocturnal), gastrointestinal bleeding (melena, rectal bleeding), abdominal pain, significant heartburn, bowel changes, GU symptoms (dysuria, hematuria, incontinence), Gyn symptoms (abnormal  bleeding, pain),  syncope, focal weakness, memory loss, numbness & tingling, skin/hair/nail changes, abnormal bruising or bleeding, anxiety, or depression.     Objective:   Physical Exam General Appearance:    Alert, cooperative, no distress, appears stated age  Head:    Normocephalic, without obvious abnormality, atraumatic  Eyes:    PERRL, conjunctiva/corneas clear, EOM's intact, fundi    benign, both eyes  Ears:    Normal TM's and external ear canals, both ears  Nose:   Nares normal, septum midline, mucosa normal, no drainage    or sinus tenderness  Throat:   Lips, mucosa, and tongue normal; teeth and gums normal  Neck:   Supple, symmetrical, trachea midline, no adenopathy;    Thyroid: no enlargement/tenderness/nodules  Back:     Symmetric, no curvature, ROM normal, no CVA tenderness  Lungs:     Clear to auscultation bilaterally, respirations unlabored  Chest Wall:    No tenderness or deformity   Heart:    Regular rate and rhythm, S1 and S2 normal, no murmur, rub   or gallop  Breast Exam:    Deferred to GYN  Abdomen:     Soft, non-tender, bowel sounds active all four quadrants,    no masses, no organomegaly  Genitalia:    Deferred to GYN  Rectal:    Extremities:   Extremities normal, atraumatic, no cyanosis or edema  Pulses:   2+ and symmetric all  extremities  Skin:   Skin color, texture, turgor normal, no rashes or lesions  Lymph nodes:   Cervical, supraclavicular, and axillary nodes normal  Neurologic:   CNII-XII intact, normal strength, sensation and reflexes    throughout          Assessment & Plan:

## 2016-08-31 NOTE — Progress Notes (Signed)
Pre visit review using our clinic review tool, if applicable. No additional management support is needed unless otherwise documented below in the visit note. 

## 2016-08-31 NOTE — Assessment & Plan Note (Signed)
Pt's PE WNL.  UTD on GYN, Tdap.  Declines flu shot.  Check labs.  Anticipatory guidance provided.

## 2016-09-01 ENCOUNTER — Encounter: Payer: Self-pay | Admitting: General Practice

## 2016-09-01 LAB — VITAMIN D 25 HYDROXY (VIT D DEFICIENCY, FRACTURES): Vit D, 25-Hydroxy: 35 ng/mL (ref 30–100)

## 2016-09-07 ENCOUNTER — Other Ambulatory Visit: Payer: Self-pay | Admitting: Family Medicine

## 2016-09-07 MED ORDER — LISDEXAMFETAMINE DIMESYLATE 60 MG PO CAPS: 60.0000 mg | ORAL_CAPSULE | ORAL | 0 refills | Status: DC

## 2016-09-07 NOTE — Telephone Encounter (Signed)
Pt needs refill on vyvanse.  °

## 2016-09-07 NOTE — Telephone Encounter (Signed)
Pt informed prescription available at front desk for pick up.

## 2016-09-07 NOTE — Telephone Encounter (Signed)
Last OV 08/31/16 (CPE) vyvanse last filled 08/05/16 #30 with 0

## 2016-09-25 ENCOUNTER — Telehealth: Payer: Self-pay | Admitting: Family Medicine

## 2016-09-25 NOTE — Telephone Encounter (Signed)
Pt needs refill of ritalin,

## 2016-09-28 NOTE — Telephone Encounter (Signed)
Spoke with pt, she does not want the refill. She will call and schedule an appt.

## 2016-09-28 NOTE — Telephone Encounter (Signed)
Last OV 08/31/16 Ritalin last filled 08/05/16 #30 with 0

## 2016-09-28 NOTE — Telephone Encounter (Signed)
Please make sure she still wants this b/c she told me over the weekend she was going to stop her Vyvanse.  If she still needs the Ritalin, ok to refill but we should schedule an appt to discuss how she wants to move forward

## 2016-12-04 ENCOUNTER — Encounter: Payer: Self-pay | Admitting: Physician Assistant

## 2016-12-04 ENCOUNTER — Ambulatory Visit (INDEPENDENT_AMBULATORY_CARE_PROVIDER_SITE_OTHER): Payer: 59 | Admitting: Physician Assistant

## 2016-12-04 VITALS — BP 108/76 | HR 76 | Temp 98.5°F | Resp 16 | Ht 63.0 in | Wt 191.0 lb

## 2016-12-04 DIAGNOSIS — R21 Rash and other nonspecific skin eruption: Secondary | ICD-10-CM

## 2016-12-04 MED ORDER — NAFTIFINE HCL 1 % EX CREA: TOPICAL_CREAM | Freq: Every day | CUTANEOUS | 0 refills | Status: DC

## 2016-12-04 NOTE — Progress Notes (Signed)
   Patient presents to clinic today c/o pruritic annular rash of L axillary region, first noticed right after christmas. Denies trauma or injury to the area. Denies drainage, tenderness or pain. Denies fever, chills, malaise. Denies similar lesion elsewhere of contact with similar symptoms. Denies change to soaps, lotions or hygiene products.   Past Medical History:  Diagnosis Date  . ADHD (attention deficit hyperactivity disorder)   . Frequent headaches     Current Outpatient Prescriptions on File Prior to Visit  Medication Sig Dispense Refill  . meloxicam (MOBIC) 15 MG tablet     . Multiple Vitamins-Minerals (MULTIVITAMIN WITH MINERALS) tablet Take 1 tablet by mouth daily.    . nitroGLYCERIN (NITRODUR - DOSED IN MG/24 HR) 0.2 mg/hr patch     . Probiotic Product (ALIGN PO) Take 1 capsule by mouth daily.    Marland [employer] lisdexamfetamine (VYVANSE) 60 MG capsule Take 1 capsule (60 mg total) by mouth every morning. (Patient not taking: Reported on 12/04/2016) 30 capsule 0  . methylphenidate (RITALIN) 5 MG tablet Once daily in the afternoon (Patient not taking: Reported on 12/04/2016) 30 tablet 0   No current facility-administered medications on file prior to visit.     No Known Allergies  Family History  Problem Relation Age of Onset  . Alcohol abuse Father   . Cancer Father     prostate  . Hyperlipidemia Father   . Stroke Father   . Hypertension Father   . Kidney disease Father   . Mental illness Father   . Diabetes Father   . Cancer Maternal Grandmother     breast  . Cancer Paternal Grandmother     breast    Social History   Social History  . Marital status: Married    Spouse name: N/A  . Number of children: N/A  . Years of education: N/A   Social History Main Topics  . Smoking status: Never Smoker  . Smokeless tobacco: Never Used  . Alcohol use Yes  . Drug use: No  . Sexual activity: Yes   Other Topics Concern  . None   Social History Narrative  . None    Review of  Systems - See HPI.  All other ROS are negative.  BP 108/76   Pulse 76   Temp 98.5 F (36.9 C) (Oral)   Resp 16   Ht 5\' 3"  (1.6 m)   Wt 191 lb (86.6 kg)   SpO2 100%   BMI 33.83 kg/m   Physical Exam  Constitutional: She is oriented to person, place, and time and well-developed, well-nourished, and in no distress.  HENT:  Head: Normocephalic and atraumatic.  Cardiovascular: Normal rate, regular rhythm, normal heart sounds and intact distal pulses.   Pulmonary/Chest: Effort normal and breath sounds normal. No respiratory distress. She has no wheezes. She has no rales. She exhibits no tenderness.  Neurological: She is alert and oriented to person, place, and time.  Skin: Skin is warm and dry.     Psychiatric: Affect normal.  Vitals reviewed.  No results found for this or any previous visit (from the past 2160 hour(s)).  Assessment/Plan: 1. Rash and nonspecific skin eruption Seems consistent with mild tinea. Rx Naftifine. Start OTC Hydrocortisone as directed. Supportive measures reviewed. FU if not improving.    Leeanne Rio, PA-C

## 2016-12-04 NOTE — Progress Notes (Signed)
Pre visit review using our clinic review tool, if applicable. No additional management support is needed unless otherwise documented below in the visit note. 

## 2016-12-04 NOTE — Patient Instructions (Signed)
Please use Naftifine cream once daily as directed for 2 weeks.  Get some OTC Hydrocortisone cream to apply to the area twice daily. Witch Onalee Hua will also help dry up area. Avoid shaving or scratching area until resolved.  If no improvement over the next week, please give [place] a call.

## 2016-12-17 DIAGNOSIS — M7711 Lateral epicondylitis, right elbow: Secondary | ICD-10-CM | POA: Diagnosis not present

## 2016-12-17 DIAGNOSIS — M25562 Pain in left knee: Secondary | ICD-10-CM | POA: Diagnosis not present

## 2016-12-23 ENCOUNTER — Encounter: Payer: Self-pay | Admitting: Family Medicine

## 2016-12-23 ENCOUNTER — Ambulatory Visit (INDEPENDENT_AMBULATORY_CARE_PROVIDER_SITE_OTHER): Payer: 59 | Admitting: Family Medicine

## 2016-12-23 VITALS — BP 121/81 | HR 70 | Temp 98.4°F | Resp 16 | Ht 63.0 in | Wt 189.1 lb

## 2016-12-23 DIAGNOSIS — F902 Attention-deficit hyperactivity disorder, combined type: Secondary | ICD-10-CM

## 2016-12-23 MED ORDER — PHENTERMINE HCL 37.5 MG PO CAPS: 37.5000 mg | ORAL_CAPSULE | ORAL | 2 refills | Status: DC

## 2016-12-23 NOTE — Progress Notes (Signed)
   Subjective:    Patient ID: Lori Zavala, female    DOB: 03/05/1976, 41 y.o.   MRN: [id number]  HPI ADHD- stopped taking Vyvanse and 'had a Vyvanse crash'.  It was causing 'horrible insomnia'.  Pt's best sx relief (per her and husband) was when she was on Phentermine which is structurally similar to Adderall.     Review of Systems For ROS see HPI     Objective:   Physical Exam  Constitutional: She is oriented to person, place, and time. She appears well-developed and well-nourished. No distress.  HENT:  Head: Normocephalic and atraumatic.  Neurological: She is alert and oriented to person, place, and time.  Skin: Skin is warm and dry.  Psychiatric: She has a normal mood and affect. Her behavior is normal. Thought content normal.  Vitals reviewed.         Assessment & Plan:

## 2016-12-23 NOTE — Patient Instructions (Signed)
Follow up as needed Start the Phentermine daily Call with any questions or concerns Hang in there!!!

## 2016-12-23 NOTE — Progress Notes (Signed)
Pre visit review using our clinic review tool, if applicable. No additional management support is needed unless otherwise documented below in the visit note. 

## 2016-12-23 NOTE — Assessment & Plan Note (Signed)
Deteriorated.  Pt's sxs were not well controlled on Vyvanse and she is not doing well off medication.  She did better on Phentermine than anything else.  Has appt upcoming w/ [facility].  Restart Phentermine daily.  Will follow.

## 2016-12-29 ENCOUNTER — Ambulatory Visit (INDEPENDENT_AMBULATORY_CARE_PROVIDER_SITE_OTHER): Payer: 59 | Admitting: Psychology

## 2016-12-29 DIAGNOSIS — F411 Generalized anxiety disorder: Secondary | ICD-10-CM | POA: Diagnosis not present

## 2016-12-31 ENCOUNTER — Ambulatory Visit (INDEPENDENT_AMBULATORY_CARE_PROVIDER_SITE_OTHER): Payer: 59 | Admitting: Family Medicine

## 2016-12-31 ENCOUNTER — Encounter: Payer: Self-pay | Admitting: Family Medicine

## 2016-12-31 VITALS — BP 123/83 | HR 82 | Temp 98.2°F | Resp 16 | Ht 63.0 in | Wt 184.5 lb

## 2016-12-31 DIAGNOSIS — G43009 Migraine without aura, not intractable, without status migrainosus: Secondary | ICD-10-CM

## 2016-12-31 DIAGNOSIS — B369 Superficial mycosis, unspecified: Secondary | ICD-10-CM | POA: Diagnosis not present

## 2016-12-31 MED ORDER — KETOROLAC TROMETHAMINE 60 MG/2ML IM SOLN
60.0000 mg | Freq: Once | INTRAMUSCULAR | Status: AC
  Administered 2016-12-31: 60 mg via INTRAMUSCULAR

## 2016-12-31 MED ORDER — MECLIZINE HCL 25 MG PO TABS: 25.0000 mg | ORAL_TABLET | Freq: Three times a day (TID) | ORAL | 0 refills | Status: DC | PRN

## 2016-12-31 MED ORDER — PROMETHAZINE HCL 25 MG PO TABS: 25.0000 mg | ORAL_TABLET | Freq: Four times a day (QID) | ORAL | 0 refills | Status: DC | PRN

## 2016-12-31 MED ORDER — PROMETHAZINE HCL 25 MG/ML IJ SOLN
25.0000 mg | Freq: Once | INTRAMUSCULAR | Status: AC
  Administered 2016-12-31: 25 mg via INTRAMUSCULAR

## 2016-12-31 MED ORDER — SUMATRIPTAN SUCCINATE 50 MG PO TABS: 50.0000 mg | ORAL_TABLET | ORAL | 0 refills | Status: DC | PRN

## 2016-12-31 MED ORDER — CLOTRIMAZOLE-BETAMETHASONE 1-0.05 % EX CREA: 1.0000 "application " | TOPICAL_CREAM | Freq: Two times a day (BID) | CUTANEOUS | 0 refills | Status: DC

## 2016-12-31 NOTE — Patient Instructions (Addendum)
Follow up by phone if no improvement in the next 24-48 hrs Take the Meclizine as needed for dizziness Take the Imitrex when you get home Use the phenergan as needed for nausea DRINK PLENTY OF FLUIDS!!!! REST! Use the Lotrisone twice daily on the rash Call with any questions or concerns Hang in there!!!

## 2016-12-31 NOTE — Progress Notes (Signed)
   Subjective:    Patient ID: Lori Zavala, female    DOB: [birth year], 41 y.o.   MRN: [id number]  HPI Migraine- sxs started Tuesday AM w/ itching, 'scratching through my clothes', developed flushing, sweating, nausea later in the day.  Pt reports 'all the symptoms of a migraine but no pain'.  + dizziness w/ turning head.  + vomiting.  Was able to eat Tuesday night.  Having confusion.  Some photophobia.  No fever.  Rash- L axilla.  Not improving w/ Naftin- spreading.  Intermittently itchy   Review of Systems For ROS see HPI     Objective:   Physical Exam  Constitutional: She is oriented to person, place, and time. She appears well-developed and well-nourished. No distress.  HENT:  Head: Normocephalic and atraumatic.  TMs WNL No TTP over sinuses Minimal nasal congestion  Eyes: Conjunctivae and EOM are normal. Pupils are equal, round, and reactive to light.  2-3 beats of horizontal nystagmus in each direction  Neck: Normal range of motion. Neck supple.  Cardiovascular: Normal rate, regular rhythm, normal heart sounds and intact distal pulses.   Pulmonary/Chest: Effort normal and breath sounds normal. No respiratory distress. She has no wheezes. She has no rales.  Lymphadenopathy:    She has no cervical adenopathy.  Neurological: She is alert and oriented to person, place, and time. She has normal reflexes. No cranial nerve deficit. Coordination normal.  Skin: Skin is warm and dry. Rash (mild fungal rash in L axilla) noted.  Psychiatric: She has a normal mood and affect. Her behavior is normal. Judgment and thought content normal.  Vitals reviewed.         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  Has a component of BPV- as evidenced by her nystagmus.  Start Meclizine prn.  Encouraged increased fluids.  Toradol and Phenergan given in office.  Pt to start  Imitrex at home.  If no improvement in next 24-48 hrs will need imaging.  Reviewed supportive care and red flags that should prompt return.  Pt expressed understanding and is in agreement w/ plan.   Rash- fungal dermatitis.  Start Lotrisone twice daily.  Reviewed supportive care and red flags that should prompt return.  Pt expressed understanding and is in agreement w/ plan.

## 2016-12-31 NOTE — Progress Notes (Signed)
Pre visit review using our clinic review tool, if applicable. No additional management support is needed unless otherwise documented below in the visit note. 

## 2017-01-06 ENCOUNTER — Ambulatory Visit (INDEPENDENT_AMBULATORY_CARE_PROVIDER_SITE_OTHER): Payer: 59 | Admitting: Family Medicine

## 2017-01-06 ENCOUNTER — Encounter: Payer: Self-pay | Admitting: Family Medicine

## 2017-01-06 VITALS — BP 112/81 | HR 78 | Temp 98.4°F | Resp 16 | Ht 63.0 in | Wt 187.6 lb

## 2017-01-06 DIAGNOSIS — J0120 Acute ethmoidal sinusitis, unspecified: Secondary | ICD-10-CM | POA: Diagnosis not present

## 2017-01-06 MED ORDER — PREDNISONE 10 MG PO TABS: ORAL_TABLET | ORAL | 0 refills | Status: DC

## 2017-01-06 MED ORDER — AMOXICILLIN 875 MG PO TABS: 875.0000 mg | ORAL_TABLET | Freq: Two times a day (BID) | ORAL | 0 refills | Status: DC

## 2017-01-06 NOTE — Progress Notes (Signed)
   Subjective:    Patient ID: Lori Zavala, female    DOB: Sep 09, 1976, 41 y.o.   MRN: [id number]  HPI URI- pt reports atypical migraine sxs have improved.  Now having extreme fatigue and nasal congestion, facial pain.  Hx of recent sinus surgery.  Bilateral ear fullness.  No fever.   Review of Systems For ROS see HPI     Objective:   Physical Exam  Constitutional: She appears well-developed and well-nourished. No distress.  HENT:  Head: Normocephalic and atraumatic.  Right Ear: Tympanic membrane normal.  Left Ear: Tympanic membrane normal.  Nose: Mucosal edema and rhinorrhea present. Right sinus exhibits no maxillary sinus tenderness and no frontal sinus tenderness. Left sinus exhibits no maxillary sinus tenderness and no frontal sinus tenderness.  Mouth/Throat: Uvula is midline and mucous membranes are normal. Posterior oropharyngeal erythema present. No oropharyngeal exudate.  Eyes: Conjunctivae and EOM are normal. Pupils are equal, round, and reactive to light.  Neck: Normal range of motion. Neck supple.  Cardiovascular: Normal rate, regular rhythm and normal heart sounds.   Pulmonary/Chest: Effort normal and breath sounds normal. No respiratory distress. She has no wheezes.  Lymphadenopathy:    She has no cervical adenopathy.  Vitals reviewed.         Assessment & Plan:

## 2017-01-06 NOTE — Assessment & Plan Note (Signed)
New.  Pt has hx of recurrent sinusitis.  S/p sinus surgery.  Start Prednisone to improve inflammation and fatigue.  Start Amox.  Reviewed supportive care and red flags that should prompt return.  Pt expressed understanding and is in agreement w/ plan.

## 2017-01-06 NOTE — Patient Instructions (Signed)
Follow up as needed Start the Amoxicillin twice daily- take w/ food Start the Prednisone as directed to improve the inflammation- take w/ food Drink plenty of fluids REST!!!  (yes, REST!!) Call with any questions or concerns Hang in there!!!

## 2017-01-26 ENCOUNTER — Ambulatory Visit (INDEPENDENT_AMBULATORY_CARE_PROVIDER_SITE_OTHER): Payer: 59 | Admitting: Psychology

## 2017-01-26 DIAGNOSIS — F411 Generalized anxiety disorder: Secondary | ICD-10-CM

## 2017-02-02 ENCOUNTER — Other Ambulatory Visit: Payer: Self-pay | Admitting: Family Medicine

## 2017-02-02 NOTE — Telephone Encounter (Signed)
Last OV 01/06/17 (sinus), no upcoming appts Phentermine last filled 12/23/2016 #30 with 2

## 2017-02-02 NOTE — Telephone Encounter (Signed)
Patient requesting refill of phentermine 37.5 MG capsule.  Pharmacy:  [id number] - [place], [place] - [address] [phone] (Phone) [phone] (Fax)

## 2017-02-03 MED ORDER — PHENTERMINE HCL 37.5 MG PO CAPS: 37.5000 mg | ORAL_CAPSULE | ORAL | 0 refills | Status: DC

## 2017-02-03 NOTE — Telephone Encounter (Signed)
[facility] for the 1 month that we filled since she sees them next week

## 2017-02-03 NOTE — Telephone Encounter (Signed)
[facility] for 1 month but please verify when her upcoming appt w/ [facility] is

## 2017-02-03 NOTE — Telephone Encounter (Signed)
Spoke with pt and she advised that she sees them on the 20th.

## 2017-02-09 DIAGNOSIS — Z79899 Other long term (current) drug therapy: Secondary | ICD-10-CM | POA: Diagnosis not present

## 2017-03-04 ENCOUNTER — Ambulatory Visit: Payer: 59 | Admitting: Psychology

## 2017-03-08 ENCOUNTER — Ambulatory Visit (INDEPENDENT_AMBULATORY_CARE_PROVIDER_SITE_OTHER): Payer: 59 | Admitting: Psychology

## 2017-03-08 DIAGNOSIS — F411 Generalized anxiety disorder: Secondary | ICD-10-CM | POA: Diagnosis not present

## 2017-03-11 ENCOUNTER — Telehealth: Payer: Self-pay | Admitting: Family Medicine

## 2017-03-11 ENCOUNTER — Other Ambulatory Visit: Payer: Self-pay | Admitting: General Practice

## 2017-03-11 DIAGNOSIS — M7711 Lateral epicondylitis, right elbow: Secondary | ICD-10-CM | POA: Diagnosis not present

## 2017-03-11 MED ORDER — PHENTERMINE HCL 37.5 MG PO CAPS: 37.5000 mg | ORAL_CAPSULE | ORAL | 0 refills | Status: DC

## 2017-03-11 NOTE — Telephone Encounter (Signed)
Pt called for a phentermine refill. Has an appt with ADHD specialist in May ok for 1 more refill?

## 2017-03-11 NOTE — Telephone Encounter (Signed)
Medication filled to pharmacy as requested.   

## 2017-03-11 NOTE — Telephone Encounter (Signed)
Disregard note.

## 2017-03-16 DIAGNOSIS — M7711 Lateral epicondylitis, right elbow: Secondary | ICD-10-CM | POA: Diagnosis not present

## 2017-03-25 DIAGNOSIS — M7711 Lateral epicondylitis, right elbow: Secondary | ICD-10-CM | POA: Diagnosis not present

## 2017-03-31 ENCOUNTER — Ambulatory Visit (INDEPENDENT_AMBULATORY_CARE_PROVIDER_SITE_OTHER): Payer: 59 | Admitting: Family Medicine

## 2017-03-31 ENCOUNTER — Encounter: Payer: Self-pay | Admitting: Family Medicine

## 2017-03-31 VITALS — BP 110/83 | HR 93 | Temp 98.0°F | Resp 16 | Ht 63.0 in | Wt 190.0 lb

## 2017-03-31 DIAGNOSIS — R490 Dysphonia: Secondary | ICD-10-CM

## 2017-03-31 DIAGNOSIS — R5383 Other fatigue: Secondary | ICD-10-CM

## 2017-03-31 DIAGNOSIS — R635 Abnormal weight gain: Secondary | ICD-10-CM

## 2017-03-31 LAB — BASIC METABOLIC PANEL
BUN: 21 mg/dL (ref 6–23)
CALCIUM: 9.2 mg/dL (ref 8.4–10.5)
CO2: 29 mEq/L (ref 19–32)
Chloride: 105 mEq/L (ref 96–112)
Creatinine, Ser: 0.84 mg/dL (ref 0.40–1.20)
GFR: 79.78 mL/min (ref >60.00)
GLUCOSE: 71 mg/dL (ref 70–99)
Potassium: 4.3 mEq/L (ref 3.5–5.1)
SODIUM: 138 meq/L (ref 135–145)

## 2017-03-31 LAB — VITAMIN D 25 HYDROXY (VIT D DEFICIENCY, FRACTURES): VITD: 37.13 ng/mL (ref 30.00–100.00)

## 2017-03-31 LAB — CBC WITH DIFFERENTIAL/PLATELET
BASOS ABS: 0 10*3/uL (ref 0.0–0.1)
BASOS PCT: 0.4 % (ref 0.0–3.0)
EOS ABS: 0.1 10*3/uL (ref 0.0–0.7)
Eosinophils Relative: 2.3 % (ref 0.0–5.0)
HCT: 40 % (ref 36.0–46.0)
Hemoglobin: 13.2 g/dL (ref 12.0–15.0)
LYMPHS ABS: 1.7 10*3/uL (ref 0.7–4.0)
LYMPHS PCT: 28.1 % (ref 12.0–46.0)
MCHC: 32.9 g/dL (ref 30.0–36.0)
MCV: 90.7 fl (ref 78.0–100.0)
MONO ABS: 0.5 10*3/uL (ref 0.1–1.0)
Monocytes Relative: 7.8 % (ref 3.0–12.0)
NEUTROS ABS: 3.6 10*3/uL (ref 1.4–7.7)
NEUTROS PCT: 61.4 % (ref 43.0–77.0)
Platelets: 249 10*3/uL (ref 150.0–400.0)
RBC: 4.41 Mil/uL (ref 3.87–5.11)
RDW: 12.8 % (ref 11.5–15.5)
WBC: 5.9 10*3/uL (ref 4.0–10.5)

## 2017-03-31 LAB — B12 AND FOLATE PANEL
FOLATE: 20.2 ng/mL (ref >5.9)
Vitamin B-12: 556 pg/mL (ref 211–911)

## 2017-03-31 LAB — T4, FREE: Free T4: 0.89 ng/dL (ref 0.60–1.60)

## 2017-03-31 LAB — T3, FREE: T3, Free: 3.4 pg/mL (ref 2.3–4.2)

## 2017-03-31 LAB — TSH: TSH: 2.02 u[IU]/mL (ref 0.35–4.50)

## 2017-03-31 NOTE — Progress Notes (Signed)
   Subjective:    Patient ID: Lori Zavala, female    DOB: 12-Feb-1976, 41 y.o.   MRN: [id number]  HPI Weight gain- pt switched to Paleo and has eliminated all carbs and sugar.  Pt has been exercising religiously but rather than losing weight, has been gaining weight.  Unable to fit in pants from last year.  Also on Phentermine for ADHD and continues to gain.  Having hoarseness, cold intolerance.  + fatigue.  Chronic hoarseness.   Review of Systems For ROS see HPI     Objective:   Physical Exam  Constitutional: She is oriented to person, place, and time. She appears well-developed and well-nourished. No distress.  HENT:  Head: Normocephalic and atraumatic.  Mouth/Throat: Oropharynx is clear and moist.  Very hoarse  Eyes: Conjunctivae and EOM are normal. Pupils are equal, round, and reactive to light.  Neck: Normal range of motion. Neck supple. No thyromegaly present.  Cardiovascular: Normal rate, regular rhythm, normal heart sounds and intact distal pulses.   No murmur heard. Pulmonary/Chest: Effort normal and breath sounds normal. No respiratory distress.  Abdominal: Soft. She exhibits no distension. There is no tenderness.  Musculoskeletal: She exhibits no edema.  Lymphadenopathy:    She has no cervical adenopathy.  Neurological: She is alert and oriented to person, place, and time.  Skin: Skin is warm and dry.  Psychiatric: She has a normal mood and affect. Her behavior is normal.  Vitals reviewed.         Assessment & Plan:  Weight gain- pt is exercising regularly (just completed a [employer] race- 13 miles of obstacles up a mountain) and has changed diet completely but continues to gain weight.  She is also on Phentermine for ADHD.  All of this is very frustrating for pt.  Her cold intolerance, fatigue, hoarseness and weight gain may be indicative of a thyroid abnormality.  Check labs.  Will follow closely.  Hoarseness- pt has had intermittent hoarseness since July  2017.  At onset, she had been yelling at a 3 day swim meet and assumed voice would normalize w/ rest.  And while voice will improve, sxs will return periodically.  If thyroid is normal, will refer back to ENT for evaluation.  Pt expressed understanding and is in agreement w/ plan.

## 2017-03-31 NOTE — Progress Notes (Signed)
Pre visit review using our clinic review tool, if applicable. No additional management support is needed unless otherwise documented below in the visit note. 

## 2017-03-31 NOTE — Patient Instructions (Signed)
Follow up as needed/scheduled We'll notify you of your lab results and make any changes if needed Continue to work on healthy diet and regular exercise- you're doing great!!! Call with any questions or concerns Hang in there!!!

## 2017-04-01 ENCOUNTER — Ambulatory Visit: Payer: 59 | Admitting: Psychology

## 2017-04-01 NOTE — Progress Notes (Signed)
Called pt and lmovm to return call.

## 2017-04-06 ENCOUNTER — Ambulatory Visit (INDEPENDENT_AMBULATORY_CARE_PROVIDER_SITE_OTHER): Payer: 59 | Admitting: Psychology

## 2017-04-06 DIAGNOSIS — F411 Generalized anxiety disorder: Secondary | ICD-10-CM

## 2017-04-07 DIAGNOSIS — Z79899 Other long term (current) drug therapy: Secondary | ICD-10-CM | POA: Diagnosis not present

## 2017-04-09 DIAGNOSIS — M7711 Lateral epicondylitis, right elbow: Secondary | ICD-10-CM | POA: Diagnosis not present

## 2017-04-14 ENCOUNTER — Ambulatory Visit (INDEPENDENT_AMBULATORY_CARE_PROVIDER_SITE_OTHER): Payer: 59 | Admitting: Psychology

## 2017-04-14 DIAGNOSIS — F411 Generalized anxiety disorder: Secondary | ICD-10-CM | POA: Diagnosis not present

## 2017-04-14 DIAGNOSIS — M7711 Lateral epicondylitis, right elbow: Secondary | ICD-10-CM | POA: Diagnosis not present

## 2017-04-21 DIAGNOSIS — K219 Gastro-esophageal reflux disease without esophagitis: Secondary | ICD-10-CM | POA: Diagnosis not present

## 2017-04-21 DIAGNOSIS — R490 Dysphonia: Secondary | ICD-10-CM | POA: Diagnosis not present

## 2017-04-21 DIAGNOSIS — G8929 Other chronic pain: Secondary | ICD-10-CM | POA: Diagnosis not present

## 2017-04-21 DIAGNOSIS — M25562 Pain in left knee: Secondary | ICD-10-CM | POA: Diagnosis not present

## 2017-04-28 DIAGNOSIS — G8929 Other chronic pain: Secondary | ICD-10-CM | POA: Diagnosis not present

## 2017-04-28 DIAGNOSIS — M25562 Pain in left knee: Secondary | ICD-10-CM | POA: Diagnosis not present

## 2017-04-29 ENCOUNTER — Ambulatory Visit (INDEPENDENT_AMBULATORY_CARE_PROVIDER_SITE_OTHER): Payer: 59 | Admitting: Psychology

## 2017-04-29 DIAGNOSIS — F411 Generalized anxiety disorder: Secondary | ICD-10-CM

## 2017-05-04 DIAGNOSIS — G8929 Other chronic pain: Secondary | ICD-10-CM | POA: Diagnosis not present

## 2017-05-04 DIAGNOSIS — M25562 Pain in left knee: Secondary | ICD-10-CM | POA: Diagnosis not present

## 2017-05-05 ENCOUNTER — Telehealth: Payer: Self-pay | Admitting: Family Medicine

## 2017-05-05 DIAGNOSIS — N898 Other specified noninflammatory disorders of vagina: Secondary | ICD-10-CM

## 2017-05-05 NOTE — Telephone Encounter (Signed)
Pt called to schedule an appt for vaginal cyst/bump, unfortunately you do not have anything open, please advise if a referral to OB is needed.

## 2017-05-05 NOTE — Telephone Encounter (Signed)
STAT referral placed.

## 2017-05-05 NOTE — Telephone Encounter (Signed)
We will do a stat referral for [facility] Lori Zavala was able to get her an appt tomorrow at 8:30)- dx vaginal cyst

## 2017-05-11 DIAGNOSIS — M25562 Pain in left knee: Secondary | ICD-10-CM | POA: Diagnosis not present

## 2017-05-11 DIAGNOSIS — G8929 Other chronic pain: Secondary | ICD-10-CM | POA: Diagnosis not present

## 2017-05-13 ENCOUNTER — Ambulatory Visit (INDEPENDENT_AMBULATORY_CARE_PROVIDER_SITE_OTHER): Payer: 59 | Admitting: Psychology

## 2017-05-13 DIAGNOSIS — F411 Generalized anxiety disorder: Secondary | ICD-10-CM

## 2017-05-25 DIAGNOSIS — M25562 Pain in left knee: Secondary | ICD-10-CM | POA: Diagnosis not present

## 2017-05-25 DIAGNOSIS — G8929 Other chronic pain: Secondary | ICD-10-CM | POA: Diagnosis not present

## 2017-06-01 ENCOUNTER — Ambulatory Visit: Payer: 59 | Admitting: Psychology

## 2017-06-08 ENCOUNTER — Ambulatory Visit (INDEPENDENT_AMBULATORY_CARE_PROVIDER_SITE_OTHER): Payer: 59 | Admitting: Psychology

## 2017-06-08 DIAGNOSIS — M25562 Pain in left knee: Secondary | ICD-10-CM | POA: Diagnosis not present

## 2017-06-08 DIAGNOSIS — F411 Generalized anxiety disorder: Secondary | ICD-10-CM

## 2017-06-08 DIAGNOSIS — G8929 Other chronic pain: Secondary | ICD-10-CM | POA: Diagnosis not present

## 2017-06-14 DIAGNOSIS — M25562 Pain in left knee: Secondary | ICD-10-CM | POA: Diagnosis not present

## 2017-06-14 DIAGNOSIS — G8929 Other chronic pain: Secondary | ICD-10-CM | POA: Diagnosis not present

## 2017-06-15 ENCOUNTER — Ambulatory Visit (INDEPENDENT_AMBULATORY_CARE_PROVIDER_SITE_OTHER): Payer: 59 | Admitting: Psychology

## 2017-06-15 DIAGNOSIS — F411 Generalized anxiety disorder: Secondary | ICD-10-CM | POA: Diagnosis not present

## 2017-06-22 ENCOUNTER — Ambulatory Visit (INDEPENDENT_AMBULATORY_CARE_PROVIDER_SITE_OTHER): Payer: 59 | Admitting: Psychology

## 2017-06-22 DIAGNOSIS — F411 Generalized anxiety disorder: Secondary | ICD-10-CM | POA: Diagnosis not present

## 2017-06-23 DIAGNOSIS — R490 Dysphonia: Secondary | ICD-10-CM | POA: Diagnosis not present

## 2017-06-23 DIAGNOSIS — M25512 Pain in left shoulder: Secondary | ICD-10-CM | POA: Diagnosis not present

## 2017-06-23 DIAGNOSIS — K219 Gastro-esophageal reflux disease without esophagitis: Secondary | ICD-10-CM | POA: Diagnosis not present

## 2017-06-29 ENCOUNTER — Ambulatory Visit (INDEPENDENT_AMBULATORY_CARE_PROVIDER_SITE_OTHER): Payer: 59 | Admitting: Psychology

## 2017-06-29 DIAGNOSIS — F411 Generalized anxiety disorder: Secondary | ICD-10-CM | POA: Diagnosis not present

## 2017-06-29 DIAGNOSIS — M25562 Pain in left knee: Secondary | ICD-10-CM | POA: Diagnosis not present

## 2017-06-29 DIAGNOSIS — G8929 Other chronic pain: Secondary | ICD-10-CM | POA: Diagnosis not present

## 2017-07-05 DIAGNOSIS — G8929 Other chronic pain: Secondary | ICD-10-CM | POA: Diagnosis not present

## 2017-07-05 DIAGNOSIS — M25562 Pain in left knee: Secondary | ICD-10-CM | POA: Diagnosis not present

## 2017-07-06 ENCOUNTER — Ambulatory Visit (INDEPENDENT_AMBULATORY_CARE_PROVIDER_SITE_OTHER): Payer: 59 | Admitting: Psychology

## 2017-07-06 DIAGNOSIS — F411 Generalized anxiety disorder: Secondary | ICD-10-CM

## 2017-07-08 DIAGNOSIS — Z79899 Other long term (current) drug therapy: Secondary | ICD-10-CM | POA: Diagnosis not present

## 2017-07-12 DIAGNOSIS — M25562 Pain in left knee: Secondary | ICD-10-CM | POA: Diagnosis not present

## 2017-07-12 DIAGNOSIS — G8929 Other chronic pain: Secondary | ICD-10-CM | POA: Diagnosis not present

## 2017-07-13 ENCOUNTER — Ambulatory Visit: Payer: 59 | Admitting: Psychology

## 2017-07-14 DIAGNOSIS — M25512 Pain in left shoulder: Secondary | ICD-10-CM | POA: Diagnosis not present

## 2017-07-16 DIAGNOSIS — M25512 Pain in left shoulder: Secondary | ICD-10-CM | POA: Diagnosis not present

## 2017-07-19 DIAGNOSIS — M25562 Pain in left knee: Secondary | ICD-10-CM | POA: Diagnosis not present

## 2017-07-19 DIAGNOSIS — G8929 Other chronic pain: Secondary | ICD-10-CM | POA: Diagnosis not present

## 2017-07-20 ENCOUNTER — Ambulatory Visit (INDEPENDENT_AMBULATORY_CARE_PROVIDER_SITE_OTHER): Payer: 59 | Admitting: Psychology

## 2017-07-20 DIAGNOSIS — F411 Generalized anxiety disorder: Secondary | ICD-10-CM | POA: Diagnosis not present

## 2017-07-27 ENCOUNTER — Ambulatory Visit: Payer: 59 | Admitting: Psychology

## 2017-07-27 ENCOUNTER — Ambulatory Visit (INDEPENDENT_AMBULATORY_CARE_PROVIDER_SITE_OTHER): Payer: 59 | Admitting: Psychology

## 2017-07-27 DIAGNOSIS — F411 Generalized anxiety disorder: Secondary | ICD-10-CM | POA: Diagnosis not present

## 2017-07-27 DIAGNOSIS — G8929 Other chronic pain: Secondary | ICD-10-CM | POA: Diagnosis not present

## 2017-07-27 DIAGNOSIS — M25562 Pain in left knee: Secondary | ICD-10-CM | POA: Diagnosis not present

## 2017-08-03 DIAGNOSIS — M25562 Pain in left knee: Secondary | ICD-10-CM | POA: Diagnosis not present

## 2017-08-03 DIAGNOSIS — G8929 Other chronic pain: Secondary | ICD-10-CM | POA: Diagnosis not present

## 2017-08-10 DIAGNOSIS — M25562 Pain in left knee: Secondary | ICD-10-CM | POA: Diagnosis not present

## 2017-08-10 DIAGNOSIS — G8929 Other chronic pain: Secondary | ICD-10-CM | POA: Diagnosis not present

## 2017-08-12 DIAGNOSIS — M25562 Pain in left knee: Secondary | ICD-10-CM | POA: Diagnosis not present

## 2017-08-12 DIAGNOSIS — G8929 Other chronic pain: Secondary | ICD-10-CM | POA: Diagnosis not present

## 2017-08-17 DIAGNOSIS — M25562 Pain in left knee: Secondary | ICD-10-CM | POA: Diagnosis not present

## 2017-08-17 DIAGNOSIS — G8929 Other chronic pain: Secondary | ICD-10-CM | POA: Diagnosis not present

## 2017-08-19 DIAGNOSIS — G8929 Other chronic pain: Secondary | ICD-10-CM | POA: Diagnosis not present

## 2017-08-19 DIAGNOSIS — M25562 Pain in left knee: Secondary | ICD-10-CM | POA: Diagnosis not present

## 2017-08-24 ENCOUNTER — Ambulatory Visit: Payer: 59 | Admitting: Psychology

## 2017-08-24 DIAGNOSIS — M25512 Pain in left shoulder: Secondary | ICD-10-CM | POA: Diagnosis not present

## 2017-08-25 DIAGNOSIS — K219 Gastro-esophageal reflux disease without esophagitis: Secondary | ICD-10-CM | POA: Diagnosis not present

## 2017-08-25 DIAGNOSIS — R490 Dysphonia: Secondary | ICD-10-CM | POA: Diagnosis not present

## 2017-09-01 DIAGNOSIS — M25512 Pain in left shoulder: Secondary | ICD-10-CM | POA: Diagnosis not present

## 2017-09-02 DIAGNOSIS — M25512 Pain in left shoulder: Secondary | ICD-10-CM | POA: Diagnosis not present

## 2017-09-08 DIAGNOSIS — M25512 Pain in left shoulder: Secondary | ICD-10-CM | POA: Diagnosis not present

## 2017-09-10 DIAGNOSIS — M25512 Pain in left shoulder: Secondary | ICD-10-CM | POA: Diagnosis not present

## 2017-09-13 ENCOUNTER — Ambulatory Visit (INDEPENDENT_AMBULATORY_CARE_PROVIDER_SITE_OTHER): Payer: 59 | Admitting: Family Medicine

## 2017-09-13 ENCOUNTER — Encounter: Payer: Self-pay | Admitting: Family Medicine

## 2017-09-13 VITALS — BP 111/80 | HR 88 | Temp 98.4°F | Resp 16 | Ht 63.0 in | Wt 197.2 lb

## 2017-09-13 DIAGNOSIS — E669 Obesity, unspecified: Secondary | ICD-10-CM | POA: Diagnosis not present

## 2017-09-13 DIAGNOSIS — Z23 Encounter for immunization: Secondary | ICD-10-CM | POA: Diagnosis not present

## 2017-09-13 DIAGNOSIS — M25512 Pain in left shoulder: Secondary | ICD-10-CM | POA: Diagnosis not present

## 2017-09-13 DIAGNOSIS — F902 Attention-deficit hyperactivity disorder, combined type: Secondary | ICD-10-CM

## 2017-09-13 MED ORDER — BUPROPION HCL ER (XL) 150 MG PO TB24: 150.0000 mg | ORAL_TABLET | Freq: Every day | ORAL | 3 refills | Status: DC

## 2017-09-13 NOTE — Progress Notes (Signed)
   Subjective:    Patient ID: Lori Zavala, female    DOB: [birth year], 41 y.o.   MRN: [id number]  HPI Obesity- pt has gained 7 lbs since last visit.  BMI is now 34.94.  Pt continues to exercise and 'eat right' (has been committed to [facility]).  Pt feels this is due to hormones and the fact that she's not sleeping well.  Pt is having periods but they are not regular.  She had 2 this months.  Has decreased carb intake which improves night sweats.   Review of Systems For ROS see HPI     Objective:   Physical Exam  Constitutional: She is oriented to person, place, and time. She appears well-developed and well-nourished. No distress.  obese  Cardiovascular: Normal rate, regular rhythm and normal heart sounds.   Pulmonary/Chest: Effort normal and breath sounds normal. No respiratory distress. She has no wheezes. She has no rales.  Neurological: She is alert and oriented to person, place, and time.  Skin: Skin is warm and dry.  Psychiatric: She has a normal mood and affect. Her behavior is normal. Thought content normal.  Vitals reviewed.         Assessment & Plan:

## 2017-09-13 NOTE — Assessment & Plan Note (Signed)
Attention is better since starting Intuniv and Mydayis but she is having increased anxiety.  Start Wellbutrin to improve both focus and anxiety.  Will follow closely.

## 2017-09-13 NOTE — Patient Instructions (Signed)
Follow up in 4-6 weeks to recheck weight loss and mood Start the Wellbutrin once daily- take in the AM Continue to work on healthy diet and regular exercise- you can do it!!! Call with any questions or concerns Hang in there!!!

## 2017-09-13 NOTE — Assessment & Plan Note (Signed)
Deteriorated.  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  I suspect a lot of this metabolic slow down is due to her peri-menopausal state.  Start Wellbutrin to improve ADHD, metabolism, and anxiety.  Will follow closely.  Pt expressed understanding and is in agreement w/ plan.

## 2017-09-17 ENCOUNTER — Encounter: Payer: Self-pay | Admitting: Family Medicine

## 2017-09-17 ENCOUNTER — Ambulatory Visit (INDEPENDENT_AMBULATORY_CARE_PROVIDER_SITE_OTHER): Payer: 59 | Admitting: Family Medicine

## 2017-09-17 VITALS — BP 128/84 | HR 84 | Temp 98.3°F | Resp 16 | Ht 63.0 in | Wt 197.0 lb

## 2017-09-17 DIAGNOSIS — J0100 Acute maxillary sinusitis, unspecified: Secondary | ICD-10-CM

## 2017-09-17 MED ORDER — PREDNISONE 10 MG PO TABS: ORAL_TABLET | ORAL | 0 refills | Status: DC

## 2017-09-17 MED ORDER — AMOXICILLIN 875 MG PO TABS: 875.0000 mg | ORAL_TABLET | Freq: Two times a day (BID) | ORAL | 0 refills | Status: DC

## 2017-09-17 NOTE — Progress Notes (Signed)
   Subjective:    Patient ID: Lori Zavala, female    DOB: 01-May-1976, 41 y.o.   MRN: [id number]  HPI URI- pt developed fever 10/23-10/25, nasal congestion w/ purulent drainage, body aches, + facial pain/pressure, fatigue.  No tooth pain.  No N/V.  + productive cough.  + sore throat.  No known sick contacts.   Review of Systems For ROS see HPI     Objective:   Physical Exam  Constitutional: She is oriented to person, place, and time. She appears well-developed and well-nourished. No distress.  HENT:  Head: Normocephalic and atraumatic.  Right Ear: Tympanic membrane normal.  Left Ear: Tympanic membrane normal.  Nose: Mucosal edema and rhinorrhea present. Right sinus exhibits maxillary sinus tenderness. Right sinus exhibits no frontal sinus tenderness. Left sinus exhibits maxillary sinus tenderness. Left sinus exhibits no frontal sinus tenderness.  Mouth/Throat: Uvula is midline and mucous membranes are normal. Posterior oropharyngeal erythema present. No oropharyngeal exudate.  Eyes: Pupils are equal, round, and reactive to light. Conjunctivae and EOM are normal.  Neck: Normal range of motion. Neck supple.  Cardiovascular: Normal rate, regular rhythm and normal heart sounds.   Pulmonary/Chest: Effort normal and breath sounds normal. No respiratory distress. She has no wheezes.  Lymphadenopathy:    She has no cervical adenopathy.  Neurological: She is alert and oriented to person, place, and time.  Psychiatric: She has a normal mood and affect. Her behavior is normal. Thought content normal.  Vitals reviewed.         Assessment & Plan:  Maxillary sinusitis- new.  Pt's sxs and PE consistent w/ infxn.  Start amox and prednisone.  Reviewed supportive care and red flags that should prompt return.  Pt expressed understanding and is in agreement w/ plan.

## 2017-09-17 NOTE — Patient Instructions (Signed)
Follow up as needed or as scheduled Start the Amoxicillin twice daily- take w/ food Start the Prednisone as directed- take w/ food.  3 tabs at the same time x3 days and then 2 tabs at the same time x3 days, and then 1 tab daily. Call with any questions or concerns Hang in there!!!

## 2017-09-20 DIAGNOSIS — M25562 Pain in left knee: Secondary | ICD-10-CM | POA: Diagnosis not present

## 2017-09-20 DIAGNOSIS — G8929 Other chronic pain: Secondary | ICD-10-CM | POA: Diagnosis not present

## 2017-09-22 DIAGNOSIS — J320 Chronic maxillary sinusitis: Secondary | ICD-10-CM | POA: Diagnosis not present

## 2017-09-22 DIAGNOSIS — J321 Chronic frontal sinusitis: Secondary | ICD-10-CM | POA: Diagnosis not present

## 2017-09-23 ENCOUNTER — Other Ambulatory Visit (INDEPENDENT_AMBULATORY_CARE_PROVIDER_SITE_OTHER): Payer: Self-pay | Admitting: Otolaryngology

## 2017-09-23 DIAGNOSIS — M25562 Pain in left knee: Secondary | ICD-10-CM | POA: Diagnosis not present

## 2017-09-23 DIAGNOSIS — G8929 Other chronic pain: Secondary | ICD-10-CM | POA: Diagnosis not present

## 2017-09-23 DIAGNOSIS — J329 Chronic sinusitis, unspecified: Secondary | ICD-10-CM

## 2017-09-24 ENCOUNTER — Ambulatory Visit
Admission: RE | Admit: 2017-09-24 | Discharge: 2017-09-24 | Disposition: A | Discharge status: Home / Self Care | Payer: 59 | Source: Ambulatory Visit | Attending: Otolaryngology | Admitting: Otolaryngology

## 2017-09-24 DIAGNOSIS — J329 Chronic sinusitis, unspecified: Secondary | ICD-10-CM | POA: Diagnosis not present

## 2017-09-28 ENCOUNTER — Ambulatory Visit: Payer: 59 | Admitting: Psychology

## 2017-09-28 DIAGNOSIS — F411 Generalized anxiety disorder: Secondary | ICD-10-CM | POA: Diagnosis not present

## 2017-09-28 DIAGNOSIS — M25562 Pain in left knee: Secondary | ICD-10-CM | POA: Diagnosis not present

## 2017-09-28 DIAGNOSIS — G8929 Other chronic pain: Secondary | ICD-10-CM | POA: Diagnosis not present

## 2017-10-06 DIAGNOSIS — M25562 Pain in left knee: Secondary | ICD-10-CM | POA: Diagnosis not present

## 2017-10-06 DIAGNOSIS — G8929 Other chronic pain: Secondary | ICD-10-CM | POA: Diagnosis not present

## 2017-10-07 DIAGNOSIS — Z79899 Other long term (current) drug therapy: Secondary | ICD-10-CM | POA: Diagnosis not present

## 2017-10-11 DIAGNOSIS — M25562 Pain in left knee: Secondary | ICD-10-CM | POA: Diagnosis not present

## 2017-10-11 DIAGNOSIS — G8929 Other chronic pain: Secondary | ICD-10-CM | POA: Diagnosis not present

## 2017-10-26 ENCOUNTER — Ambulatory Visit: Payer: 59 | Admitting: Psychology

## 2017-10-26 DIAGNOSIS — F411 Generalized anxiety disorder: Secondary | ICD-10-CM | POA: Diagnosis not present

## 2017-10-27 DIAGNOSIS — K219 Gastro-esophageal reflux disease without esophagitis: Secondary | ICD-10-CM | POA: Diagnosis not present

## 2017-10-27 DIAGNOSIS — R490 Dysphonia: Secondary | ICD-10-CM | POA: Diagnosis not present

## 2017-11-17 ENCOUNTER — Ambulatory Visit: Payer: Self-pay | Admitting: Family Medicine

## 2017-11-18 ENCOUNTER — Ambulatory Visit: Payer: Self-pay | Admitting: Family Medicine

## 2017-11-18 DIAGNOSIS — J382 Nodules of vocal cords: Secondary | ICD-10-CM | POA: Diagnosis not present

## 2017-11-18 DIAGNOSIS — R490 Dysphonia: Secondary | ICD-10-CM | POA: Diagnosis not present

## 2017-11-24 ENCOUNTER — Ambulatory Visit: Payer: Self-pay | Admitting: Family Medicine

## 2017-11-25 ENCOUNTER — Encounter: Payer: Self-pay | Admitting: Family Medicine

## 2017-11-25 ENCOUNTER — Other Ambulatory Visit: Payer: Self-pay

## 2017-11-25 ENCOUNTER — Ambulatory Visit: Payer: 59 | Admitting: Family Medicine

## 2017-11-25 VITALS — BP 124/80 | HR 76 | Resp 16 | Ht 63.0 in | Wt 196.5 lb

## 2017-11-25 DIAGNOSIS — E349 Endocrine disorder, unspecified: Secondary | ICD-10-CM

## 2017-11-25 DIAGNOSIS — E669 Obesity, unspecified: Secondary | ICD-10-CM

## 2017-11-25 MED ORDER — INSULIN PEN NEEDLE 32G X 6 MM MISC: 1 refills | Status: DC

## 2017-11-25 MED ORDER — LIRAGLUTIDE -WEIGHT MANAGEMENT 18 MG/3ML ~~LOC~~ SOPN: PEN_INJECTOR | SUBCUTANEOUS | 0 refills | Status: AC

## 2017-11-25 MED ORDER — LIRAGLUTIDE -WEIGHT MANAGEMENT 18 MG/3ML ~~LOC~~ SOPN: 3.0000 mg | PEN_INJECTOR | Freq: Every day | SUBCUTANEOUS | 3 refills | Status: DC

## 2017-11-25 NOTE — Patient Instructions (Signed)
Follow up in 8 weeks to recheck weight loss progress START the Saxenda at 0.6mg  daily x1 week and then increase each week as directed If at any point the nausea is too much, delay increasing the dose by 1 week The 3 mg daily dose is the continuation dose We'll call you with your Endo appt Call with any questions or concerns Hang in there!

## 2017-11-25 NOTE — Assessment & Plan Note (Signed)
Deteriorated.  Pt is extremely frustrated that she has totally re-vamped her diet and is exercising with a trainer and organized class on a regular schedule.  She and her husband are eating and exercising together and he is losing weight but she is not.  She has struggled with hormonal issues her whole life and fears that there is an underlying metabolic or hormonal issue that is preventing weight loss.  All previous thyroid levels have been normal.  She is asking to start [place] while pursuing and Endo workup.  Medication prescribed and referral placed.  Will follow.

## 2017-11-25 NOTE — Progress Notes (Signed)
   Subjective:    Patient ID: Lori Zavala, female    DOB: 10-Sep-1976, 42 y.o.   MRN: [id number]  HPI Obesity- pt's BMI is 34.81  Pt is on Wellbutrin for anxiety but doesn't note improvement in appetite.  Pt is concerned her weight is hormonal (also genetics).  She is working out regularly, eating well (she and husband are doing this together and he is in training for 1/2 [employer]).  Starting to have some menstrual irregularity.  Not eating large amounts of food or struggling w/ cravings.  Doesn't snack throughout the day.  Son and daughter have both had hormonal/endo issues.  She is very frustrated w/ lack of progress despite her best efforts.   Review of Systems For ROS see HPI     Objective:   Physical Exam  Constitutional: She is oriented to person, place, and time. She appears well-developed and well-nourished. No distress.  obese  HENT:  Head: Normocephalic and atraumatic.  Eyes: Conjunctivae and EOM are normal. Pupils are equal, round, and reactive to light.  Neck: Normal range of motion. Neck supple. No thyromegaly present.  Cardiovascular: Normal rate, regular rhythm, normal heart sounds and intact distal pulses.  No murmur heard. Pulmonary/Chest: Effort normal and breath sounds normal. No respiratory distress.  Abdominal: Soft. She exhibits no distension. There is no tenderness.  Musculoskeletal: She exhibits no edema.  Lymphadenopathy:    She has no cervical adenopathy.  Neurological: She is alert and oriented to person, place, and time.  Skin: Skin is warm and dry.  Psychiatric: She has a normal mood and affect. Her behavior is normal.  Vitals reviewed.         Assessment & Plan:

## 2017-11-29 ENCOUNTER — Ambulatory Visit: Payer: 59 | Admitting: Psychology

## 2017-11-29 DIAGNOSIS — F411 Generalized anxiety disorder: Secondary | ICD-10-CM

## 2017-12-03 ENCOUNTER — Telehealth: Payer: Self-pay | Admitting: Family Medicine

## 2017-12-03 MED ORDER — PHENTERMINE-TOPIRAMATE ER 3.75-23 MG PO CP24: 1.0000 | ORAL_CAPSULE | Freq: Every day | ORAL | 0 refills | Status: DC

## 2017-12-03 MED ORDER — PHENTERMINE-TOPIRAMATE ER 7.5-46 MG PO CP24: 1.0000 | ORAL_CAPSULE | Freq: Every day | ORAL | 1 refills | Status: DC

## 2017-12-03 NOTE — Telephone Encounter (Signed)
Called pt and left a detailed message on voicemail to inform.  

## 2017-12-03 NOTE — Telephone Encounter (Signed)
Pt called indicating the Saxenda was far too expensive and is asking if Qsymia would be an option.  I am happy to send Qsymia while she is waiting on an Endo work up.  Start with the 3.75/23mg  dose x14 days and then increase to the 7.5/46mg  dose daily.  She cannot stop this abruptly.  If she doesn't like it, she needs to decrease dose to every other day for 1 week prior to stopping

## 2018-01-04 ENCOUNTER — Ambulatory Visit: Payer: 59 | Admitting: Psychology

## 2018-01-04 DIAGNOSIS — F411 Generalized anxiety disorder: Secondary | ICD-10-CM | POA: Diagnosis not present

## 2018-01-11 DIAGNOSIS — J382 Nodules of vocal cords: Secondary | ICD-10-CM | POA: Diagnosis not present

## 2018-01-11 DIAGNOSIS — R490 Dysphonia: Secondary | ICD-10-CM | POA: Diagnosis not present

## 2018-01-11 DIAGNOSIS — Z79899 Other long term (current) drug therapy: Secondary | ICD-10-CM | POA: Diagnosis not present

## 2018-01-13 ENCOUNTER — Encounter: Payer: Self-pay | Admitting: Family Medicine

## 2018-01-21 ENCOUNTER — Other Ambulatory Visit: Payer: Self-pay

## 2018-01-21 ENCOUNTER — Ambulatory Visit: Payer: 59 | Admitting: Family Medicine

## 2018-01-21 ENCOUNTER — Encounter: Payer: Self-pay | Admitting: Family Medicine

## 2018-01-21 VITALS — BP 122/82 | HR 78 | Temp 98.1°F | Resp 16 | Ht 63.0 in | Wt 193.4 lb

## 2018-01-21 DIAGNOSIS — J382 Nodules of vocal cords: Secondary | ICD-10-CM | POA: Diagnosis not present

## 2018-01-21 DIAGNOSIS — E669 Obesity, unspecified: Secondary | ICD-10-CM

## 2018-01-21 DIAGNOSIS — R490 Dysphonia: Secondary | ICD-10-CM | POA: Diagnosis not present

## 2018-01-21 NOTE — Progress Notes (Signed)
   Subjective:    Patient ID: Lori Zavala, female    DOB: [birth year], 42 y.o.   MRN: [id number]  HPI Obesity- pt is down 4 lbs since January visit.  On Qsymia.  Exercising regularly- training for [employer].  Has Endo appt on March 19th.  Pt has had FSH, LH, estrogen levels at OB/GYN which were WNL.  Pt is VERY frustrated w/ lack of results given the amount of effort and the use of medication.  Tracking nutrition w/ My[employer]itness[employer] and each day has been w/in nutrition goal.  + fatigue.   Review of Systems For ROS see HPI     Objective:   Physical Exam  Constitutional: She is oriented to person, place, and time. She appears well-developed and well-nourished. No distress.  obese  HENT:  Head: Normocephalic and atraumatic.  Neurological: She is alert and oriented to person, place, and time.  Skin: Skin is warm and dry.  Psychiatric: She has a normal mood and affect. Her behavior is normal. Thought content normal.  Very frustrated  Vitals reviewed.         Assessment & Plan:

## 2018-01-21 NOTE — Assessment & Plan Note (Signed)
Ongoing issue.  Pt is VERY frustrated w/ lack of weight loss.  She is able to document her good eating using [employer] and is working out at least 3x/week.  She is now on Qsymia x1 month and has only lost 4 lbs.  She is under tremendous stress recently while her son is being worked up for a rare genetic disorder.  I have concern for abnormal Cortisol levels.  Has Endo appt pending.  Will follow closely.

## 2018-01-21 NOTE — Patient Instructions (Signed)
Schedule your complete physical in 6 months Continue the Qsymia daily until you see Dr Buddy Duty Continue to log your food in My[employer]itness[employer] and track your exercise Call with any questions or concerns Hang in there!!!

## 2018-01-28 ENCOUNTER — Other Ambulatory Visit: Payer: Self-pay | Admitting: Family Medicine

## 2018-01-28 ENCOUNTER — Encounter: Payer: Self-pay | Admitting: Family Medicine

## 2018-01-28 DIAGNOSIS — Z0100 Encounter for examination of eyes and vision without abnormal findings: Secondary | ICD-10-CM | POA: Diagnosis not present

## 2018-01-28 DIAGNOSIS — H538 Other visual disturbances: Secondary | ICD-10-CM

## 2018-02-01 ENCOUNTER — Ambulatory Visit: Payer: 59 | Admitting: Psychology

## 2018-02-01 DIAGNOSIS — F411 Generalized anxiety disorder: Secondary | ICD-10-CM

## 2018-02-02 ENCOUNTER — Encounter: Payer: Self-pay | Admitting: Family Medicine

## 2018-02-08 ENCOUNTER — Encounter: Payer: Self-pay | Admitting: Diagnostic Neuroimaging

## 2018-02-08 ENCOUNTER — Encounter: Payer: Self-pay | Admitting: *Deleted

## 2018-02-08 ENCOUNTER — Ambulatory Visit: Payer: 59 | Admitting: Diagnostic Neuroimaging

## 2018-02-08 VITALS — BP 124/78 | HR 98 | Ht 63.0 in | Wt 195.8 lb

## 2018-02-08 DIAGNOSIS — R202 Paresthesia of skin: Secondary | ICD-10-CM

## 2018-02-08 DIAGNOSIS — R200 Anesthesia of skin: Secondary | ICD-10-CM

## 2018-02-08 DIAGNOSIS — H579 Unspecified disorder of eye and adnexa: Secondary | ICD-10-CM | POA: Diagnosis not present

## 2018-02-08 NOTE — Patient Instructions (Signed)
-   check MRI brain and orbits

## 2018-02-08 NOTE — Progress Notes (Signed)
[facility]  PATIENT: TAMERA PINGLEY DOB: [birth year]  REFERRING CLINICIAN: Alver Fisher, MD HISTORY FROM: patient and chart review  REASON FOR VISIT: new consult    HISTORICAL  CHIEF COMPLAINT:  Chief Complaint  Patient presents with  . NP  Annye Asa, MD  . Blurred Vision    L eye (decreased color perception (for about 3 wks.) Dr. Gershon Crane (eyes ok).      HISTORY OF PRESENT ILLNESS:   42 year old female here for evaluation of left eye blurred vision.  February 2019 patient had onset of blurred vision in her left eye.  She also noticed decreased red intensity when looking through her left eye.  She went to her eye doctor who did ophthalmologic evaluation and found no specific abnormality.  Symptoms have gradually improved but not back to baseline.  Patient also has constellation of other symptoms including 2 years of unexplained hoarse voice, many years of extreme fatigue, one year of muscle tightness, difficulty losing weight in spite of optimizing nutrition and exercise, restless leg syndrome, cold sensitivity, migraine headaches 2-3 times per year, intermittent motion sickness, irritable bowel syndrome, numbness in fingertips, worsening ADHD.   REVIEW OF SYSTEMS: Full 14 system review of systems performed and negative with exception of: Weight gain fatigue blurred vision eye pain.  Averages 7-8 hours sleep per night.  Negative sleep study in 2013.  ALLERGIES: No Known Allergies  HOME MEDICATIONS: Outpatient Medications Prior to Visit  Medication Sig Dispense Refill  . buPROPion (WELLBUTRIN XL) 300 MG 24 hr tablet     . cetirizine (ZYRTEC) 10 MG tablet Take 10 mg by mouth daily.    . clotrimazole-betamethasone (LOTRISONE) cream Apply 1 application topically 2 (two) times daily. (Patient taking differently: Apply 1 application topically 2 (two) times daily as needed. ) 45 g 0  . guanFACINE (INTUNIV) 1 MG TB24 ER tablet Take 2 mg by mouth daily.     .  Multiple Vitamins-Minerals (MULTIVITAMIN WITH MINERALS) tablet Take 1 tablet by mouth daily.    Marland Kitchen omeprazole (PRILOSEC) 20 MG capsule Take 20 mg by mouth daily.    . Phentermine-Topiramate (QSYMIA) 7.5-46 MG CP24 Take 1 capsule by mouth daily. 30 capsule 1  . Probiotic Product (ALIGN PO) Take 1 capsule by mouth daily.    . ranitidine (ZANTAC) 150 MG capsule Take 150 mg by mouth 2 (two) times daily.    . TURMERIC PO Take 700 mg by mouth daily.     No facility-administered medications prior to visit.     PAST MEDICAL HISTORY: Past Medical History:  Diagnosis Date  . ADHD (attention deficit hyperactivity disorder)   . Frequent headaches     PAST SURGICAL HISTORY: Past Surgical History:  Procedure Laterality Date  . BREAST SURGERY Bilateral 07/2014   reduction  . CESAREAN SECTION     has had 2  . COSMETIC SURGERY     breast reduction, Sept 2015  . dental implant    . ganlgion cyst Bilateral   . MANDIBLE SURGERY    . prk      FAMILY HISTORY: Family History  Problem Relation Age of Onset  . Alcohol abuse Father   . Cancer Father        prostate  . Hyperlipidemia Father   . Stroke Father   . Hypertension Father   . Kidney disease Father   . Mental illness Father   . Diabetes Father   . Cancer Maternal Grandmother  breast  . Cancer Paternal Grandmother        breast    SOCIAL HISTORY:  Social History   Socioeconomic History  . Marital status: Married    Spouse name: Not on file  . Number of children: Not on file  . Years of education: Not on file  . Highest education level: Not on file  Social Needs  . Financial resource strain: Not on file  . Food insecurity - worry: Not on file  . Food insecurity - inability: Not on file  . Transportation needs - medical: Not on file  . Transportation needs - non-medical: Not on file  Occupational History  . Not on file  Tobacco Use  . Smoking status: Never Smoker  . Smokeless tobacco: Never Used  Substance and  Sexual Activity  . Alcohol use: Yes    Comment: once monthly  . Drug use: Yes    Types: Marijuana    Comment: quit college  . Sexual activity: Yes  Other Topics Concern  . Not on file  Social History Narrative   Lives home with husband and 2 kids.  [employer] and Production designer, theatre/television/film (realtor).  Education+  [employer].       PHYSICAL EXAM  GENERAL EXAM/CONSTITUTIONAL: Vitals:  Vitals:   02/08/18 1313  BP: 124/78  Pulse: 98  Weight: 195 lb 12.8 oz (88.8 kg)  Height: 5\' 3"  (1.6 m)     Body mass index is 34.68 kg/m.  Visual Acuity Screening   Right eye Left eye Both eyes  Without correction: 20/20 20/20   With correction:        Patient is in no distress; well developed, nourished and groomed; neck is supple  CARDIOVASCULAR:  Examination of carotid arteries is normal; no carotid bruits  Regular rate and rhythm, no murmurs  Examination of peripheral vascular system by observation and palpation is normal  EYES:  Ophthalmoscopic exam of optic discs and posterior segments is normal; no papilledema or hemorrhages  MUSCULOSKELETAL:  Gait, strength, tone, movements noted in Neurologic exam below  NEUROLOGIC: MENTAL STATUS:  No flowsheet data found.  awake, alert, oriented to person, place and time  recent and remote memory intact  normal attention and concentration  language fluent, comprehension intact, naming intact,   fund of knowledge appropriate  CRANIAL NERVE:   2nd - no papilledema on fundoscopic exam  2nd, 3rd, 4th, 6th - pupils equal and reactive to light, visual fields full to confrontation, extraocular muscles intact, no nystagmus  5th - facial sensation symmetric  7th - facial strength symmetric  8th - hearing intact  9th - palate elevates symmetrically, uvula midline  11th - shoulder shrug symmetric  12th - tongue protrusion midline  MOTOR:   normal bulk and tone, full strength in the BUE, BLE  SENSORY:   normal and symmetric  to light touch, pinprick, temperature, vibration; EXCEPT DECR PP IN LEFT HAND FINGERTIPS  COORDINATION:   finger-nose-finger, fine finger movements normal  REFLEXES:   deep tendon reflexes present and symmetric  GAIT/STATION:   narrow based gait; able to walk on toes, heels and tandem; romberg is negative    DIAGNOSTIC DATA (LABS, IMAGING, TESTING) - I reviewed patient records, labs, notes, testing and imaging myself where available.  Lab Results  Component Value Date   WBC 5.9 03/31/2017   HGB 13.2 03/31/2017   HCT 40.0 03/31/2017   MCV 90.7 03/31/2017   PLT 249.0 03/31/2017      Component Value Date/Time  NA 138 03/31/2017 0858   K 4.3 03/31/2017 0858   CL 105 03/31/2017 0858   CO2 29 03/31/2017 0858   GLUCOSE 71 03/31/2017 0858   BUN 21 03/31/2017 0858   CREATININE 0.84 03/31/2017 0858   CREATININE 0.92 08/31/2016 1519   CALCIUM 9.2 03/31/2017 0858   PROT 6.4 08/31/2016 1519   ALBUMIN 4.0 08/31/2016 1519   AST 21 08/31/2016 1519   ALT 25 08/31/2016 1519   ALKPHOS 45 08/31/2016 1519   BILITOT 0.4 08/31/2016 1519   GFRNONAA 87.82 02/18/2010 0914   Lab Results  Component Value Date   CHOL 149 08/31/2016   HDL 53 08/31/2016   LDLCALC 69 08/31/2016   TRIG 133 08/31/2016   CHOLHDL 2.8 08/31/2016   No results found for: HGBA1C Lab Results  Component Value Date   IWLNLGXQ11 941 03/31/2017   Lab Results  Component Value Date   TSH 2.02 03/31/2017        ASSESSMENT AND PLAN  42 y.o. year old female here with new onset left eye blurred vision, gradually improving.  Also with constellation of other symptoms including numbness, fatigue, muscle tightness and other symptoms as in HPI.  We will proceed with further workup.  Dx:  1. Numbness and tingling   2. Left eye complaint      PLAN:  - check MRI brain and orbits (rule out CNS autoimmune, inflamm, demyelinating dz, mass, infx) - may consider further workup after MRI  Orders Placed This  Encounter  Procedures  . MR BRAIN W WO CONTRAST  . MR ORBITS W WO CONTRAST   Return pending test results.    Penni Bombard, MD 7/40/8144, 8:18 PM Certified in Neurology, Neurophysiology and Neuroimaging  [facility] [address], [place], [place] [address] [phone]

## 2018-02-09 ENCOUNTER — Telehealth: Payer: Self-pay | Admitting: Diagnostic Neuroimaging

## 2018-02-09 NOTE — Telephone Encounter (Signed)
Scheduled at [facility] mobile unit for 02/15/18.

## 2018-02-09 NOTE — Telephone Encounter (Signed)
[employer] Auth: [id number] (exp. 02/09/18 to 03/26/18) & [id number] (exp. 02/09/18 to 03/26/18).

## 2018-02-15 ENCOUNTER — Ambulatory Visit: Payer: 59

## 2018-02-15 DIAGNOSIS — R202 Paresthesia of skin: Secondary | ICD-10-CM | POA: Diagnosis not present

## 2018-02-15 DIAGNOSIS — H579 Unspecified disorder of eye and adnexa: Secondary | ICD-10-CM

## 2018-02-15 DIAGNOSIS — R200 Anesthesia of skin: Secondary | ICD-10-CM | POA: Diagnosis not present

## 2018-02-15 MED ORDER — GADOPENTETATE DIMEGLUMINE 469.01 MG/ML IV SOLN
18.0000 mL | Freq: Once | INTRAVENOUS | Status: AC | PRN
  Administered 2018-02-15: 18 mL via INTRAVENOUS

## 2018-02-17 ENCOUNTER — Other Ambulatory Visit: Payer: Self-pay | Admitting: Family Medicine

## 2018-02-17 ENCOUNTER — Telehealth: Payer: Self-pay | Admitting: *Deleted

## 2018-02-17 NOTE — Telephone Encounter (Signed)
LVM informing patient her MRI brain and MRI orbits showed unremarkable results; no masses or inflammation. Advised her that Dr Gladstone Lighter note stated he may consider further work up; he returns to the office next Monday. Left number for any questions.

## 2018-02-17 NOTE — Telephone Encounter (Signed)
Last OV 01/21/18 Qsymia last filled 12/03/17 #30 with 1

## 2018-02-21 NOTE — Telephone Encounter (Addendum)
Patient called back stating she and husband talked over weekend. Her husband stated this is not like her and they both would like more testing to be done. She would like to know what further work up Dr Leta Baptist recommends now.  Advised her will send to Dr Leta Baptist and call her back. She verbalized understanding, appreciation.

## 2018-02-22 NOTE — Telephone Encounter (Addendum)
I called patient.  MRI brain and orbits have ruled out major or serious neurologic conditions.  No clear explanation for patient's vision symptoms.  Neurologic examination was unremarkable related to her vision symptoms.  Could have been migraine phenomenon or other factor.  Regarding patient's other constellation of symptoms including fatigue, muscle tightness, ADHD, cold sensitivity, inability to lose weight, I do not have a specific neurologic explanation.  Patient does endorse significant anxiety after the onset of these different physical symptoms.  Patient has had severe stress factors previously in her life, although she does not endorse them currently.  Discussed with patient to potentially consider further evaluation and treatment of underlying anxiety disorder with PCP or psychiatry.    Also patient planning to see endocrinology to rule out hormonal or metabolic causes of her symptoms.  No further neurologic testing recommended at this time.    Penni Bombard, MD 12/01/1658, 6:00 PM Certified in Neurology, Neurophysiology and Neuroimaging  [facility] [address], [place], [place] [address] [phone]

## 2018-02-23 NOTE — Telephone Encounter (Signed)
Error

## 2018-03-02 ENCOUNTER — Encounter: Payer: Self-pay | Admitting: Family Medicine

## 2018-03-02 ENCOUNTER — Other Ambulatory Visit: Payer: Self-pay | Admitting: Family Medicine

## 2018-03-02 DIAGNOSIS — R490 Dysphonia: Secondary | ICD-10-CM | POA: Diagnosis not present

## 2018-03-02 DIAGNOSIS — K219 Gastro-esophageal reflux disease without esophagitis: Secondary | ICD-10-CM | POA: Diagnosis not present

## 2018-03-02 DIAGNOSIS — M2550 Pain in unspecified joint: Secondary | ICD-10-CM

## 2018-03-03 IMAGING — CT CT MAXILLOFACIAL W/O CM
1 series · 15 of 30 positions shown, 19 images · non-contrast
Comparison: 07/07/2016 CT sinus

CLINICAL DATA: 41 y/o F; chronic sinusitis. Septoplasty and
turbinate reduction.

EXAM:
CT MAXILLOFACIAL WITHOUT CONTRAST
TECHNIQUE: Multidetector CT images of the paranasal sinuses were obtained using
the standard protocol without intravenous contrast.

[Series 4: soft tissue · axial · 0.46mm/px · z∈[-161,-10]mm · 15 of 163 slices shown, 19 images]
[im 6/163  brain]
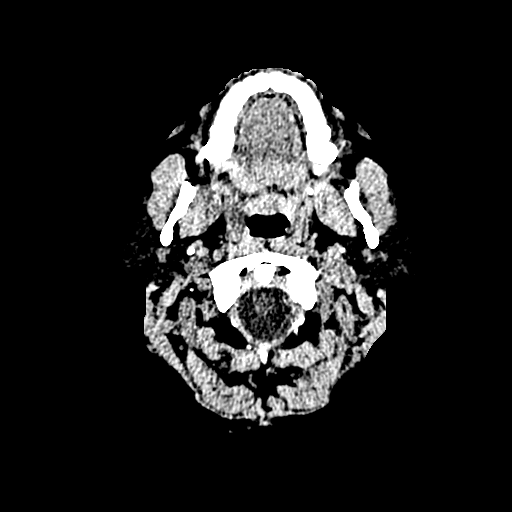
[im 6/163  bone]
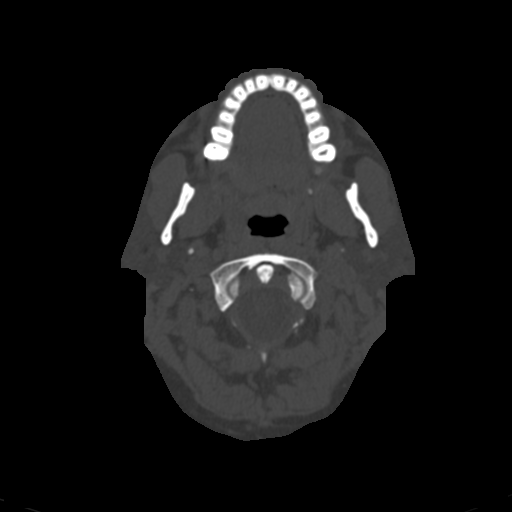
[im 17/163  bone]
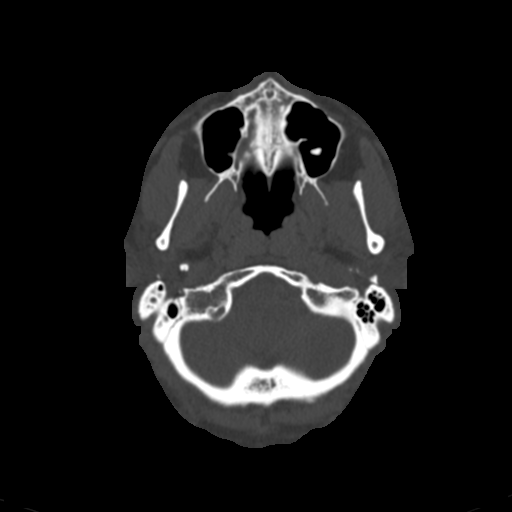
[im 28/163  bone]
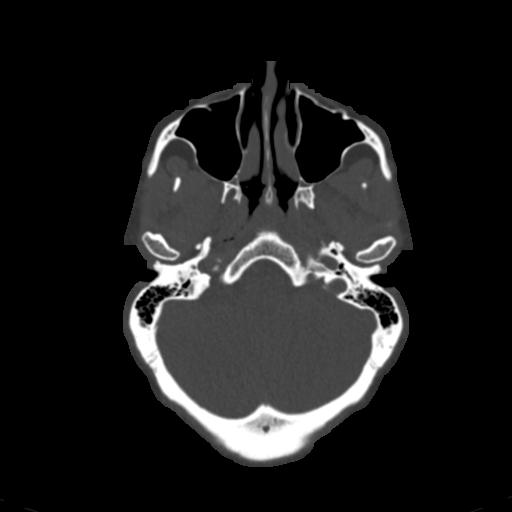
[im 40/163  bone]
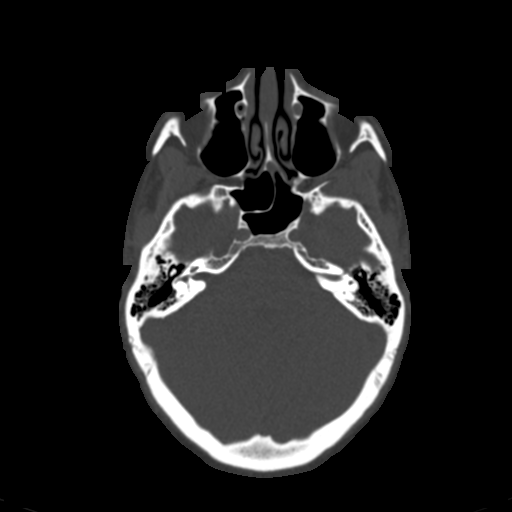
[im 51/163  brain]
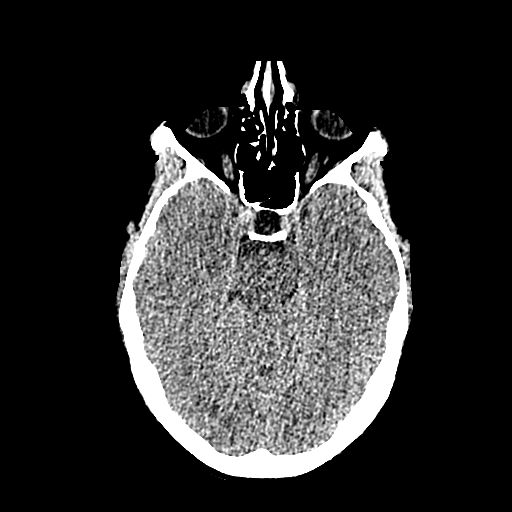
[im 51/163  bone]
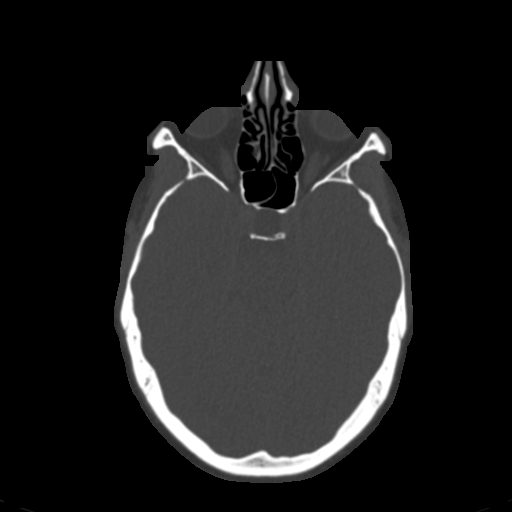
[im 62/163  bone]
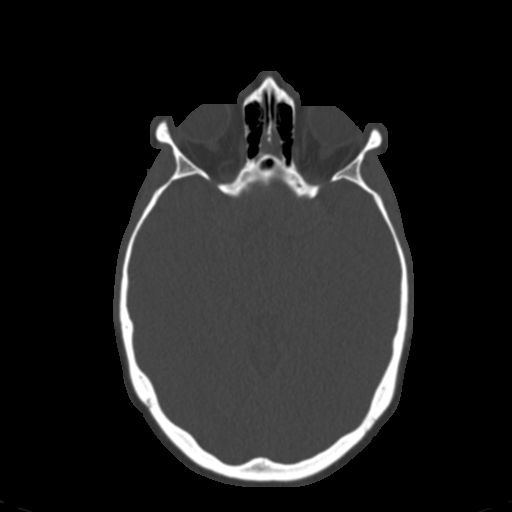
[im 73/163  bone]
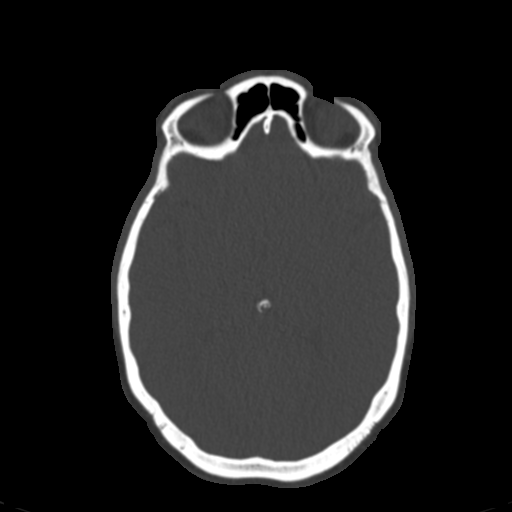
[im 84/163  bone]
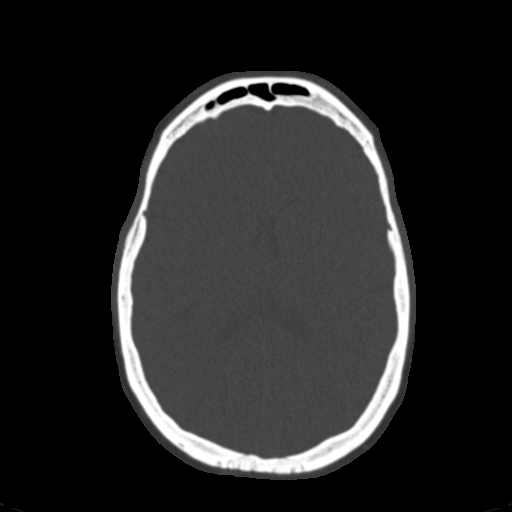
[im 90/163  brain]
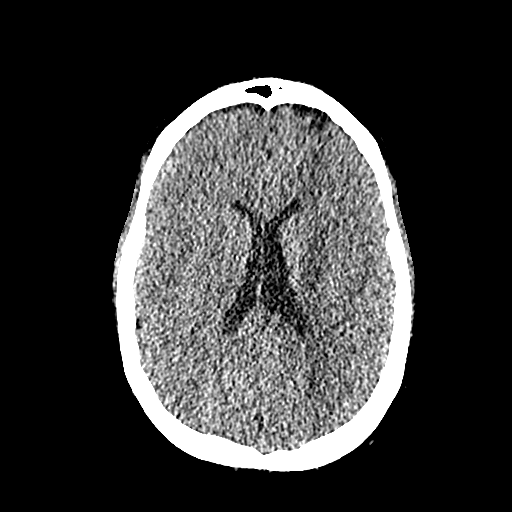
[im 90/163  bone]
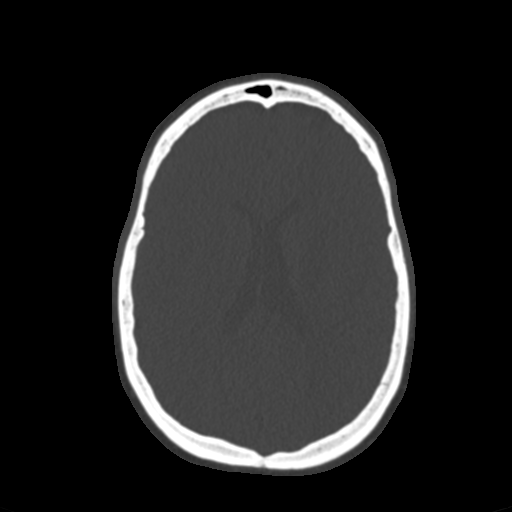
[im 101/163  bone]
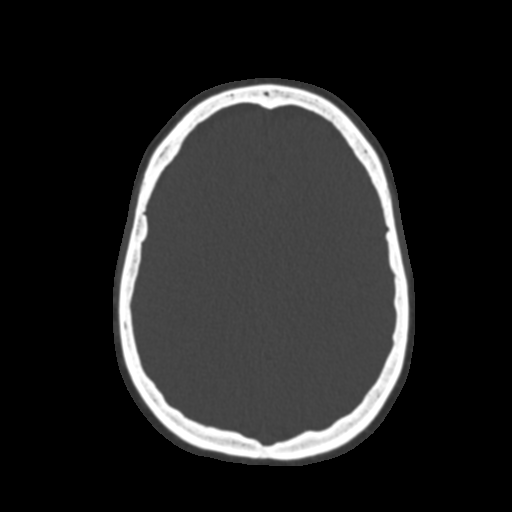
[im 112/163  bone]
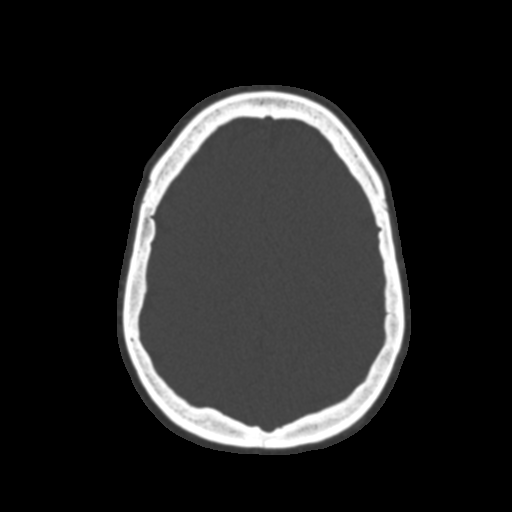
[im 123/163  bone]
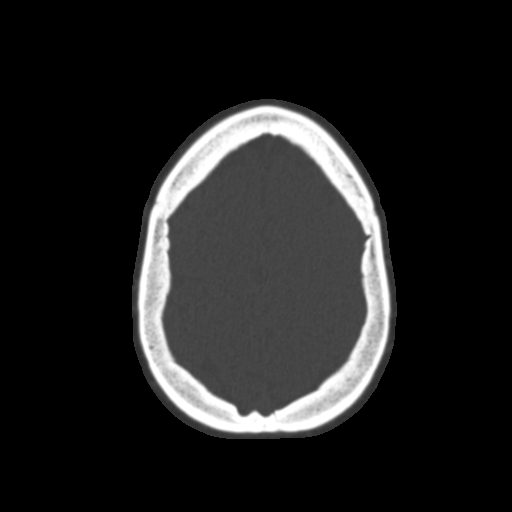
[im 135/163  brain]
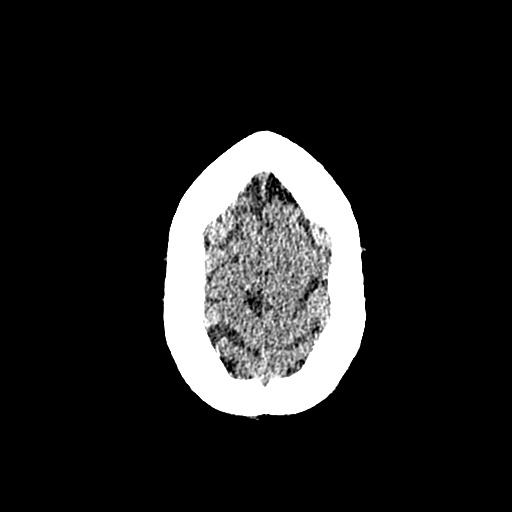
[im 135/163  bone]
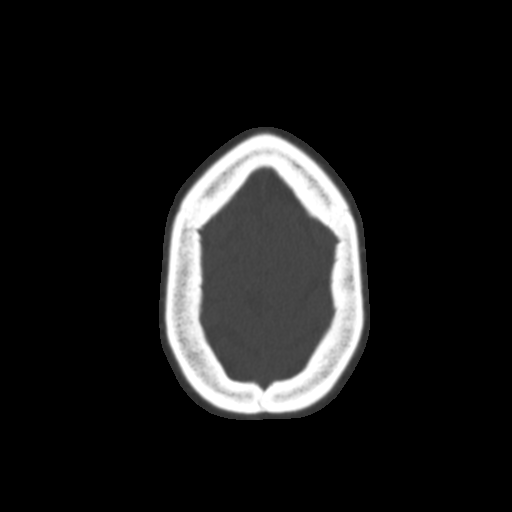
[im 146/163  bone]
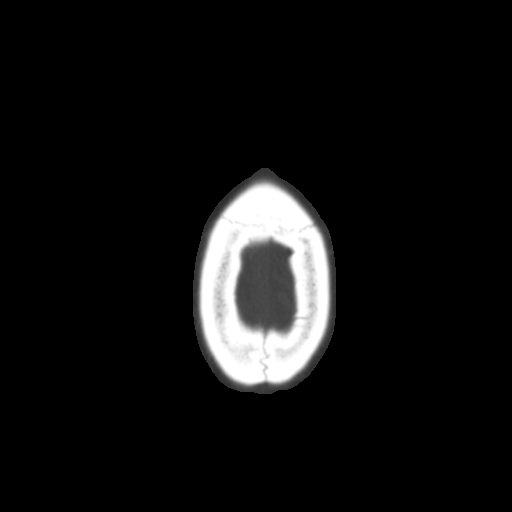
[im 157/163  bone]
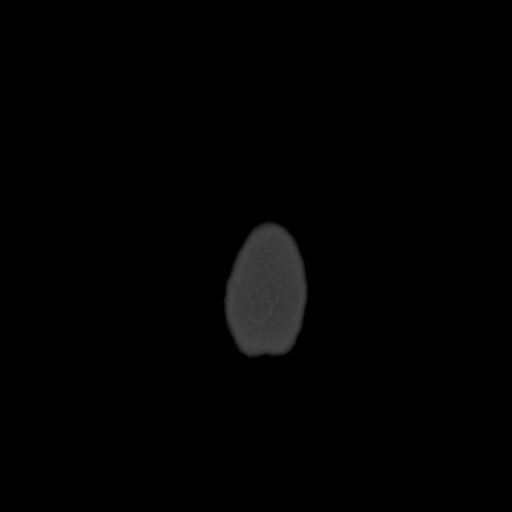

[15 of 30 positions shown; findings below may reference images not displayed]

FINDINGS: Paranasal sinuses:

Frontal: Normally aerated. Patent frontal sinus drainage pathways.

Ethmoid: Normally aerated.

Maxillary: Normally aerated.

Sphenoid: Mucosal thickening within left anterior sphenoid sinus,
otherwise normally aerated. Patent sphenoethmoidal recesses.

Right ostiomeatal unit: Patent.

Left ostiomeatal unit: Patent.

Nasal passages: Patent. Septoplasty postsurgical changes with the
nasal septum nearly in midline with minimal rightward deviation.
Partial paradoxical right middle turbinate.

Anatomy: Pneumatization superior to anterior ethmoid notches.
Symmetric and intact olfactory grooves, right fovea ethmoidalis is 2
mm higher than left, Keros II (4-7mm). Sellar sphenoid
pneumatization pattern. Partially empty sella turcica with mild
downward bulging and thin sellar bony plate with the sphenoid sinus.

Other: Orbits and intracranial compartment are unremarkable. Visible
mastoid air cells are normally aerated.
IMPRESSION: Mild left anterior ethmoid sinus mucosal thickening. Otherwise
normally aerated paranasal sinuses. Patent sinus drainage pathways.

By: Ktama Papicha M.D.

## 2018-03-04 ENCOUNTER — Ambulatory Visit: Payer: 59 | Admitting: Psychology

## 2018-03-07 ENCOUNTER — Ambulatory Visit: Payer: 59 | Admitting: Psychology

## 2018-03-07 DIAGNOSIS — F411 Generalized anxiety disorder: Secondary | ICD-10-CM | POA: Diagnosis not present

## 2018-03-14 DIAGNOSIS — M79604 Pain in right leg: Secondary | ICD-10-CM | POA: Diagnosis not present

## 2018-03-18 DIAGNOSIS — M79604 Pain in right leg: Secondary | ICD-10-CM | POA: Diagnosis not present

## 2018-03-22 ENCOUNTER — Encounter: Payer: Self-pay | Admitting: Family Medicine

## 2018-03-23 NOTE — Telephone Encounter (Signed)
Spoke to pt made her aware that [facility] should be reaching out to her to schedule an appt.

## 2018-03-24 DIAGNOSIS — M7631 Iliotibial band syndrome, right leg: Secondary | ICD-10-CM | POA: Diagnosis not present

## 2018-03-24 DIAGNOSIS — M7632 Iliotibial band syndrome, left leg: Secondary | ICD-10-CM | POA: Diagnosis not present

## 2018-03-25 ENCOUNTER — Encounter: Payer: Self-pay | Admitting: Family Medicine

## 2018-03-29 ENCOUNTER — Encounter: Payer: Self-pay | Admitting: Family Medicine

## 2018-03-29 DIAGNOSIS — M7632 Iliotibial band syndrome, left leg: Secondary | ICD-10-CM | POA: Diagnosis not present

## 2018-03-29 DIAGNOSIS — M7631 Iliotibial band syndrome, right leg: Secondary | ICD-10-CM | POA: Diagnosis not present

## 2018-03-30 ENCOUNTER — Encounter: Payer: Self-pay | Admitting: Family Medicine

## 2018-03-31 DIAGNOSIS — M7631 Iliotibial band syndrome, right leg: Secondary | ICD-10-CM | POA: Diagnosis not present

## 2018-03-31 DIAGNOSIS — M7632 Iliotibial band syndrome, left leg: Secondary | ICD-10-CM | POA: Diagnosis not present

## 2018-04-01 ENCOUNTER — Ambulatory Visit: Payer: 59 | Admitting: Psychology

## 2018-04-01 DIAGNOSIS — F411 Generalized anxiety disorder: Secondary | ICD-10-CM

## 2018-04-04 ENCOUNTER — Encounter: Payer: Self-pay | Admitting: Neurology

## 2018-04-05 DIAGNOSIS — M7631 Iliotibial band syndrome, right leg: Secondary | ICD-10-CM | POA: Diagnosis not present

## 2018-04-05 DIAGNOSIS — M7632 Iliotibial band syndrome, left leg: Secondary | ICD-10-CM | POA: Diagnosis not present

## 2018-04-06 ENCOUNTER — Ambulatory Visit: Payer: Self-pay | Admitting: Diagnostic Neuroimaging

## 2018-04-06 ENCOUNTER — Encounter

## 2018-04-07 DIAGNOSIS — M7632 Iliotibial band syndrome, left leg: Secondary | ICD-10-CM | POA: Diagnosis not present

## 2018-04-07 DIAGNOSIS — M7631 Iliotibial band syndrome, right leg: Secondary | ICD-10-CM | POA: Diagnosis not present

## 2018-04-12 DIAGNOSIS — R5383 Other fatigue: Secondary | ICD-10-CM | POA: Diagnosis not present

## 2018-04-12 DIAGNOSIS — M62838 Other muscle spasm: Secondary | ICD-10-CM | POA: Diagnosis not present

## 2018-04-12 DIAGNOSIS — M542 Cervicalgia: Secondary | ICD-10-CM | POA: Diagnosis not present

## 2018-04-12 DIAGNOSIS — M7631 Iliotibial band syndrome, right leg: Secondary | ICD-10-CM | POA: Diagnosis not present

## 2018-04-12 DIAGNOSIS — M545 Low back pain: Secondary | ICD-10-CM | POA: Diagnosis not present

## 2018-04-12 DIAGNOSIS — M7632 Iliotibial band syndrome, left leg: Secondary | ICD-10-CM | POA: Diagnosis not present

## 2018-04-12 DIAGNOSIS — M791 Myalgia, unspecified site: Secondary | ICD-10-CM | POA: Diagnosis not present

## 2018-04-14 DIAGNOSIS — M7631 Iliotibial band syndrome, right leg: Secondary | ICD-10-CM | POA: Diagnosis not present

## 2018-04-14 DIAGNOSIS — M7632 Iliotibial band syndrome, left leg: Secondary | ICD-10-CM | POA: Diagnosis not present

## 2018-04-15 NOTE — Progress Notes (Signed)
Office Visit Note  Patient: Lori Zavala             Date of Birth: 10/31/1976           MRN: [id number]             PCP: Midge Minium, MD Referring: Midge Minium, MD Visit Date: 04/29/2018 Occupation: realator    Subjective:  Muscle tightness.   History of Present Illness: Lori Zavala is a 42 y.o. female seen in consultation per request of her PCP.  According to patient about a year ago she developed symptoms of vertigo which lasted for a week.  And then the symptoms resolved themselves.  She had 2 episodes of hidradenitis in 2004 and 2009 without any recurrence since then.  In March 2019 she developed left eye blurry vision hands change in the color perception which lasted for about a month.  She was seen by Dr. Gershon Crane who did not find anything wrong with her eye exam.  She was evaluated by Dr. Leta Baptist for possible MS.  MRI of the brain was unremarkable and he did could not establish a etiology for her symptoms.  She states she has been experiencing a lot of muscle tightness without any muscle pain or spasm.  She does have hyperalgesia to touch.  She is also was experiencing some knee discomfort.  She was to physical therapy by Dr. Layne Benton.  She states she was in physical therapy for 6 months without much help.  She had dry needling which was helpful.  She has recurrence of muscle stiffness and tightness now.  She has been experiencing muscle stiffness in her quads and her calves.  She states she was evaluated by Dr. Layne Benton about 2 months ago at the time she had labs which included CBC, ESR, CRP, ANA, RF, anti-CCP, vitamin D and CK which were within normal limits.  I do not have the report of these labs.  She continues to have muscle tightness and fatigue.  She states she ran 4 mile obstacle course race last weekend and had no difficulty running.  She had more discomfort when she is sitting for prolonged time.  She has appointment coming up with Dr. Tomi Likens a neurologist for  second opinion and also an appointment with endocrinologist Dr. Buddy Duty.  He denies any joint swelling or joint pain.  Activities of Daily Living:  Patient reports morning stiffness for 0 minutes.   Patient Denies nocturnal pain.  Difficulty dressing/grooming: Denies Difficulty climbing stairs: Reports Difficulty getting out of chair: Reports Difficulty using hands for taps, buttons, cutlery, and/or writing: Denies   Review of Systems  Constitutional: Positive for fatigue. Negative for night sweats, weight gain and weight loss.  HENT: Negative for mouth sores, trouble swallowing, trouble swallowing, mouth dryness and nose dryness.   Eyes: Negative for pain, redness, visual disturbance and dryness.  Respiratory: Negative for cough, shortness of breath and difficulty breathing.   Cardiovascular: Negative for chest pain, palpitations, hypertension, irregular heartbeat and swelling in legs/feet.  Gastrointestinal: Negative for abdominal pain, blood in stool, constipation and diarrhea.  Endocrine: Negative for increased urination.  Genitourinary: Negative for pelvic pain and vaginal dryness.  Musculoskeletal: Positive for myalgias, muscle tenderness and myalgias. Negative for arthralgias, gait problem, joint pain, joint swelling, muscle weakness and morning stiffness.  Skin: Positive for rash. Negative for color change, hair loss, skin tightness, ulcers and sensitivity to sunlight.       Axillary tinea versicolor  Allergic/Immunologic: Negative for  susceptible to infections.  Neurological: Negative for dizziness, light-headedness, headaches, memory loss, night sweats and weakness.  Hematological: Negative for bruising/bleeding tendency and swollen glands.  Psychiatric/Behavioral: Negative for depressed mood, confusion and sleep disturbance. The patient is not nervous/anxious.     PMFS History:  Patient Active Problem List   Diagnosis Date Noted  . Attention deficit hyperactivity disorder  (ADHD) 02/17/2016  . Stye 11/01/2015  . Physical exam 07/16/2015  . Ethmoid sinusitis 02/13/2015  . Obesity (BMI 30-39.9) 05/02/2010  . ACUTE BRONCHITIS 04/27/2010  . TINEA CORPORIS 08/05/2009  . BACK PAIN, THORACIC REGION 08/05/2009  . CERUMEN IMPACTION 04/09/2009  . SEBACEOUS CYST 04/09/2009  . VIRAL URI 09/07/2008  . ANEMIA-NOS 02/06/2008  . OTITIS EXTERNA 02/06/2008  . CHICKENPOX, HX OF 02/06/2008    Past Medical History:  Diagnosis Date  . ADHD (attention deficit hyperactivity disorder)   . Frequent headaches     Family History  Problem Relation Age of Onset  . Alcohol abuse Father   . Cancer Father        prostate  . Hyperlipidemia Father   . Stroke Father   . Hypertension Father   . Kidney disease Father   . Mental illness Father   . Diabetes Father   . Cancer Maternal Grandmother        breast  . Cancer Paternal Grandmother        breast  . Bipolar disorder Sister   . Mental retardation Brother    Past Surgical History:  Procedure Laterality Date  . BREAST SURGERY Bilateral 07/2014   reduction  . CESAREAN SECTION     has had 2  . COSMETIC SURGERY     breast reduction, Sept 2015  . dental implant    . ganlgion cyst Bilateral   . MANDIBLE SURGERY    . NASAL SEPTUM SURGERY  2016  . prk  2005   Social History   Social History Narrative   Lives home with husband and 2 kids.  [employer] Production designer, theatre/television/film (realtor).  Education+  [employer].       Objective: Vital Signs: BP 122/81 (BP Location: Right Arm, Patient Position: Sitting, Cuff Size: Normal)   Pulse 77   Resp 15   Ht _0  (1.6 m)   Wt 202 lb (91.6 kg)   BMI 35.78 kg/m    Physical Exam  Constitutional: She is oriented to person, place, and time. She appears well-developed and well-nourished.  HENT:  Head: Normocephalic and atraumatic.  Eyes: Conjunctivae and EOM are normal.  Neck: Normal range of motion.  Cardiovascular: Normal rate, regular rhythm, normal heart sounds and intact  distal pulses.  Pulmonary/Chest: Effort normal and breath sounds normal.  Abdominal: Soft. Bowel sounds are normal.  Lymphadenopathy:    She has no cervical adenopathy.  Neurological: She is alert and oriented to person, place, and time.  No muscle weakness was noted.  She has tenderness on palpation of her quads and calves.  She was able to get up from the squatting position.  Skin: Skin is warm and dry. Capillary refill takes less than 2 seconds.  Psychiatric: She has a normal mood and affect. Her behavior is normal.  Nursing note and vitals reviewed.    Musculoskeletal Exam: C-spine thoracic lumbar spine good range of motion.  Shoulder joints elbow joints wrist joint MCPs PIPs DIPs been good range of motion with no synovitis.  Hip joints knee joints ankles MTPs PIPs DIPs with good range of motion with no  synovitis.  She has some hypermobility in her wrist joints PIP and DIP joints.  She also has hypermobility in her ankle joints.  CDAI Exam: No CDAI exam completed.    Investigation: No additional findings.  Component     Latest Ref Rng & Units 03/31/2017  Vitamin B12     211 - 911 pg/mL 556  Folate     >5.9 ng/mL 20.2  VITD     30.00 - 100.00 ng/mL 37.13  Triiodothyronine,Free,Serum     2.3 - 4.2 pg/mL 3.4  T4,Free(Direct)     0.60 - 1.60 ng/dL 0.89   CBC Latest Ref Rng & Units 03/31/2017 08/31/2016 07/16/2015  WBC 4.0 - 10.5 K/uL 5.9 8.3 4.8  Hemoglobin 12.0 - 15.0 g/dL 13.2 12.4 12.4  Hematocrit 36.0 - 46.0 % 40.0 37.8 37.3  Platelets 150.0 - 400.0 K/uL 249.0 251 218.0   CMP Latest Ref Rng & Units 03/31/2017 08/31/2016 07/16/2015  Glucose 70 - 99 mg/dL 71 70 89  BUN 6 - 23 mg/dL _0 Creatinine 0.40 - 1.20 mg/dL 0.84 0.92 0.77  Sodium 135 - 145 mEq/L 138 139 139  Potassium 3.5 - 5.1 mEq/L 4.3 4.1 4.6  Chloride 96 - 112 mEq/L 105 104 104  CO2 19 - 32 mEq/L _1 Calcium 8.4 - 10.5 mg/dL 9.2 9.1 9.6  Total Protein 6.1 - 8.1 g/dL - 6.4 6.7  Total Bilirubin 0.2 - 1.2  mg/dL - 0.4 0.6  Alkaline Phos 33 - 115 U/L - 45 48  AST 10 - 30 U/L - 21 18  ALT 6 - 29 U/L - 25 17     Imaging: No results found.  Speciality Comments: No specialty comments available.    Procedures:  No procedures performed Allergies: Patient has no known allergies.   Assessment / Plan:     Visit Diagnoses: Muscle stiffness-patient gives history of muscle stiffness after prolonged sitting.  She also has difficulty walking and doing routine activities due to stiffness.  She is very active and does obstacle race.  She also works out on a regular basis.  According to patient all the autoimmune work-up was negative which was done at Dr. Kathrin Penner office.  Her CK was normal.  She has evaluation by Dr. Leta Baptist which was unremarkable.  She is also going for a second opinion to Dr. Tomi Likens.  I have advised her to forward the labs from Dr. Kathrin Penner office to [place]. Hyperalgesia-she does have some generalized hyperalgesia which could be a component of myofascial pain.  Myofascial pain could be associated with hypermobility.  Hypermobility of joint-she has some hypermobility in her wrist PIPs DIPs and her ankle joints.  She does not have much arthralgias.  Tinea corporis-she uses topical agents which are useful.  History of ADHD -she is currently on medications.   Orders: No orders of the defined types were placed in this encounter.  No orders of the defined types were placed in this encounter.   Face-to-face time spent with patient was 50 minutes. >50% of time was spent in counseling and coordination of care.  Follow-Up Instructions: Return if symptoms worsen or fail to improve.   Bo Merino, MD  Note - This record has been created using Editor, commissioning.  Chart creation errors have been sought, but may not always  have been located. Such creation errors do not reflect on  the standard of medical care.

## 2018-04-19 DIAGNOSIS — M7632 Iliotibial band syndrome, left leg: Secondary | ICD-10-CM | POA: Diagnosis not present

## 2018-04-19 DIAGNOSIS — M7631 Iliotibial band syndrome, right leg: Secondary | ICD-10-CM | POA: Diagnosis not present

## 2018-04-21 DIAGNOSIS — M7632 Iliotibial band syndrome, left leg: Secondary | ICD-10-CM | POA: Diagnosis not present

## 2018-04-21 DIAGNOSIS — M7631 Iliotibial band syndrome, right leg: Secondary | ICD-10-CM | POA: Diagnosis not present

## 2018-04-22 DIAGNOSIS — M542 Cervicalgia: Secondary | ICD-10-CM | POA: Diagnosis not present

## 2018-04-26 DIAGNOSIS — M7632 Iliotibial band syndrome, left leg: Secondary | ICD-10-CM | POA: Diagnosis not present

## 2018-04-26 DIAGNOSIS — M7631 Iliotibial band syndrome, right leg: Secondary | ICD-10-CM | POA: Diagnosis not present

## 2018-04-28 DIAGNOSIS — M7631 Iliotibial band syndrome, right leg: Secondary | ICD-10-CM | POA: Diagnosis not present

## 2018-04-28 DIAGNOSIS — M7632 Iliotibial band syndrome, left leg: Secondary | ICD-10-CM | POA: Diagnosis not present

## 2018-04-29 ENCOUNTER — Ambulatory Visit: Payer: 59 | Admitting: Rheumatology

## 2018-04-29 ENCOUNTER — Encounter: Payer: Self-pay | Admitting: Rheumatology

## 2018-04-29 VITALS — BP 122/81 | HR 77 | Resp 15 | Ht 63.0 in | Wt 202.0 lb

## 2018-04-29 DIAGNOSIS — B354 Tinea corporis: Secondary | ICD-10-CM

## 2018-04-29 DIAGNOSIS — M545 Low back pain: Secondary | ICD-10-CM | POA: Diagnosis not present

## 2018-04-29 DIAGNOSIS — M249 Joint derangement, unspecified: Secondary | ICD-10-CM

## 2018-04-29 DIAGNOSIS — Z8659 Personal history of other mental and behavioral disorders: Secondary | ICD-10-CM | POA: Diagnosis not present

## 2018-04-29 DIAGNOSIS — M6289 Other specified disorders of muscle: Secondary | ICD-10-CM | POA: Diagnosis not present

## 2018-05-03 DIAGNOSIS — M7632 Iliotibial band syndrome, left leg: Secondary | ICD-10-CM | POA: Diagnosis not present

## 2018-05-03 DIAGNOSIS — M7631 Iliotibial band syndrome, right leg: Secondary | ICD-10-CM | POA: Diagnosis not present

## 2018-05-06 DIAGNOSIS — M7632 Iliotibial band syndrome, left leg: Secondary | ICD-10-CM | POA: Diagnosis not present

## 2018-05-06 DIAGNOSIS — M7631 Iliotibial band syndrome, right leg: Secondary | ICD-10-CM | POA: Diagnosis not present

## 2018-05-09 DIAGNOSIS — M7631 Iliotibial band syndrome, right leg: Secondary | ICD-10-CM | POA: Diagnosis not present

## 2018-05-09 DIAGNOSIS — M7632 Iliotibial band syndrome, left leg: Secondary | ICD-10-CM | POA: Diagnosis not present

## 2018-05-11 DIAGNOSIS — M7631 Iliotibial band syndrome, right leg: Secondary | ICD-10-CM | POA: Diagnosis not present

## 2018-05-11 DIAGNOSIS — M7632 Iliotibial band syndrome, left leg: Secondary | ICD-10-CM | POA: Diagnosis not present

## 2018-05-12 ENCOUNTER — Ambulatory Visit: Payer: 59 | Admitting: Psychology

## 2018-05-12 DIAGNOSIS — F411 Generalized anxiety disorder: Secondary | ICD-10-CM | POA: Diagnosis not present

## 2018-05-17 DIAGNOSIS — M7631 Iliotibial band syndrome, right leg: Secondary | ICD-10-CM | POA: Diagnosis not present

## 2018-05-17 DIAGNOSIS — M7632 Iliotibial band syndrome, left leg: Secondary | ICD-10-CM | POA: Diagnosis not present

## 2018-05-19 DIAGNOSIS — M7632 Iliotibial band syndrome, left leg: Secondary | ICD-10-CM | POA: Diagnosis not present

## 2018-05-19 DIAGNOSIS — M7631 Iliotibial band syndrome, right leg: Secondary | ICD-10-CM | POA: Diagnosis not present

## 2018-05-23 DIAGNOSIS — M7632 Iliotibial band syndrome, left leg: Secondary | ICD-10-CM | POA: Diagnosis not present

## 2018-05-23 DIAGNOSIS — M7631 Iliotibial band syndrome, right leg: Secondary | ICD-10-CM | POA: Diagnosis not present

## 2018-05-25 DIAGNOSIS — M7632 Iliotibial band syndrome, left leg: Secondary | ICD-10-CM | POA: Diagnosis not present

## 2018-05-25 DIAGNOSIS — M7631 Iliotibial band syndrome, right leg: Secondary | ICD-10-CM | POA: Diagnosis not present

## 2018-05-27 ENCOUNTER — Ambulatory Visit: Payer: 59 | Admitting: Psychology

## 2018-05-27 DIAGNOSIS — M2550 Pain in unspecified joint: Secondary | ICD-10-CM | POA: Insufficient documentation

## 2018-05-27 DIAGNOSIS — F411 Generalized anxiety disorder: Secondary | ICD-10-CM

## 2018-05-27 NOTE — Progress Notes (Deleted)
Office Visit Note  Patient: Lori Zavala             Date of Birth: 11/18/1976           MRN: [id number]             PCP: Midge Minium, MD Referring: Midge Minium, MD Visit Date: 06/09/2018 Occupation: @GUAROCC @    Subjective:  No chief complaint on file.   History of Present Illness: Lori Zavala is a 42 y.o. female ***   Activities of Daily Living:  Patient reports morning stiffness for *** {minute/hour:19697}.   Patient {ACTIONS;DENIES/REPORTS:21021675::"Denies"} nocturnal pain.  Difficulty dressing/grooming: {ACTIONS;DENIES/REPORTS:21021675::"Denies"} Difficulty climbing stairs: {ACTIONS;DENIES/REPORTS:21021675::"Denies"} Difficulty getting out of chair: {ACTIONS;DENIES/REPORTS:21021675::"Denies"} Difficulty using hands for taps, buttons, cutlery, and/or writing: {ACTIONS;DENIES/REPORTS:21021675::"Denies"}   No Rheumatology ROS completed.   PMFS History:  Patient Active Problem List   Diagnosis Date Noted  . 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 Attention deficit hyperactivity disorder (ADHD) 02/17/2016  . Stye 11/01/2015  . Physical exam 07/16/2015  . Ethmoid sinusitis 02/13/2015  . Obesity (BMI 30-39.9) 05/02/2010  . ACUTE BRONCHITIS 04/27/2010  . TINEA CORPORIS 08/05/2009  . BACK PAIN, THORACIC REGION 08/05/2009  . CERUMEN IMPACTION 04/09/2009  . SEBACEOUS CYST 04/09/2009  . VIRAL URI 09/07/2008  . ANEMIA-NOS 02/06/2008  . OTITIS EXTERNA 02/06/2008  . CHICKENPOX, HX OF 02/06/2008    Past Medical History:  Diagnosis Date  . ADHD (attention deficit hyperactivity disorder)   . Frequent headaches     Family History  Problem Relation Age of Onset  . Alcohol abuse Father   . Cancer Father        prostate  . Hyperlipidemia Father   . Stroke Father   . Hypertension Father   . Kidney disease Father   . Mental illness Father   . Diabetes Father   . Cancer Maternal Grandmother        breast  . Cancer Paternal Grandmother        breast    . Bipolar disorder Sister   . Mental retardation Brother    Past Surgical History:  Procedure Laterality Date  . BREAST SURGERY Bilateral 07/2014   reduction  . CESAREAN SECTION     has had 2  . COSMETIC SURGERY     breast reduction, Sept 2015  . dental implant    . ganlgion cyst Bilateral   . MANDIBLE SURGERY    . NASAL SEPTUM SURGERY  2016  . prk  2005   Social History   Social History Narrative   Lives home with husband and 2 kids.  [employer] and Production designer, theatre/television/film (realtor).  Education+  [employer].       Objective: Vital Signs: There were no vitals taken for this visit.   Physical Exam   Musculoskeletal Exam: ***  CDAI Exam: No CDAI exam completed.    Investigation: No additional findings. CBC Latest Ref Rng & Units 03/31/2017 08/31/2016 07/16/2015  WBC 4.0 - 10.5 K/uL 5.9 8.3 4.8  Hemoglobin 12.0 - 15.0 g/dL 13.2 12.4 12.4  Hematocrit 36.0 - 46.0 % 40.0 37.8 37.3  Platelets 150.0 - 400.0 K/uL 249.0 251 218.0   CMP Latest Ref Rng & Units 03/31/2017 08/31/2016 07/16/2015  Glucose 70 - 99 mg/dL 71 70 89  BUN 6 - 23 mg/dL 21 18 14   Creatinine 0.40 - 1.20 mg/dL 0.84 0.92 0.77  Sodium 135 - 145 mEq/L 138 139 139  Potassium 3.5 - 5.1 mEq/L 4.3 4.1 4.6  Chloride 96 -  112 mEq/L 105 104 104  CO2 19 - 32 mEq/L 29 24 30   Calcium 8.4 - 10.5 mg/dL 9.2 9.1 9.6  Total Protein 6.1 - 8.1 g/dL - 6.4 6.7  Total Bilirubin 0.2 - 1.2 mg/dL - 0.4 0.6  Alkaline Phos 33 - 115 U/L - 45 48  AST 10 - 30 U/L - 21 18  ALT 6 - 29 U/L - 25 17     Imaging: No results found.  Speciality Comments: No specialty comments available.    Procedures:  No procedures performed Allergies: Patient has no known allergies.   Assessment / Plan:     Visit Diagnoses: Muscle stiffness - evaluated by Dr. Layne Benton and Dr. Cassie Freer WNL advised to forward labs performed by Dr. Layne Benton  Hyperalgesia  Generalized hypermobility of joints  Tinea corporis  History of ADHD    Orders: No orders of  the defined types were placed in this encounter.  No orders of the defined types were placed in this encounter.   Face-to-face time spent with patient was *** minutes. Greater than 50% of time was spent in counseling and coordination of care.  Follow-Up Instructions: No follow-ups on file.   Ofilia Neas, PA-C  Note - This record has been created using Dragon software.  Chart creation errors have been sought, but may not always  have been located. Such creation errors do not reflect on  the standard of medical care.

## 2018-06-02 DIAGNOSIS — M25562 Pain in left knee: Secondary | ICD-10-CM | POA: Diagnosis not present

## 2018-06-07 ENCOUNTER — Other Ambulatory Visit: Payer: 59

## 2018-06-07 ENCOUNTER — Encounter: Payer: Self-pay | Admitting: Neurology

## 2018-06-07 ENCOUNTER — Ambulatory Visit: Payer: 59 | Admitting: Neurology

## 2018-06-07 VITALS — BP 98/70 | HR 97 | Ht 63.0 in | Wt 201.0 lb

## 2018-06-07 DIAGNOSIS — H538 Other visual disturbances: Secondary | ICD-10-CM | POA: Diagnosis not present

## 2018-06-07 DIAGNOSIS — M6289 Other specified disorders of muscle: Secondary | ICD-10-CM

## 2018-06-07 DIAGNOSIS — R200 Anesthesia of skin: Secondary | ICD-10-CM

## 2018-06-07 DIAGNOSIS — M6281 Muscle weakness (generalized): Secondary | ICD-10-CM | POA: Diagnosis not present

## 2018-06-07 DIAGNOSIS — R202 Paresthesia of skin: Secondary | ICD-10-CM

## 2018-06-07 NOTE — Patient Instructions (Addendum)
1.  I will review labs already performed 2.  We will check Lyme titer 3.  We will check NCV-EMG of right leg and left arm 4.  Further recommendations pending results.  Your provider has requested that you have labwork completed today. Please go to [facility] ([id number]) on the second floor of this building before leaving the office today. You do not need to check in. If you are not called within 15 minutes please check with the front desk.

## 2018-06-07 NOTE — Progress Notes (Signed)
NEUROLOGY CONSULTATION NOTE  Lori Zavala MRN: [id number] DOB: 13-Apr-1976  Referring provider: Dr. Birdie Riddle Primary care provider: Dr. Birdie Riddle  Reason for consult:  Second opinion  HISTORY OF PRESENT ILLNESS: Lori Zavala is a 42 year old right-handed female who presents for multiple symptomatology.  History supplemented by notes available from PCP, rheumatology and prior neurologist.  She is accompanied by her husband and friend who supplements history.  She started experiencing muscle tightness and pain for about a year and a half.  She first had tightness and pain in her thighs which required physical therapy.  Gradually, she started experiencing stiffness in all of her extremities.  She has difficulty moving her legs when she first stands up to walk.  She has some pain but it is not significant.  She has been diagnosed and treated for left knee pain and right epicondylitis.  She also was found to have a left shoulder labrum SLAP tear.  She is athletic and exercises often.  She participates in 3 mud runs a year.  Due to this muscle stiffness and feeling of weakness, she is now in the back of the pack during races.    She also reports hoarse voice which she was told by ENT was due to "silent reflux".  She reports fatigue.  In February, she developed blurred vision in the left eye with decreased color perception, particularly of the color red.  She was evaluated by ophthalmologist Dr. Gershon Crane who found no abnormalities.  Symptoms lasted 2 weeks and resolved.  Around the same time, she developed numbness in the fingers of her left hand.  She was evaluated by neurologist Dr. Leta Baptist.  MRI of brain and orbits from 02/15/18 was personally reviewed and was unremarkable.   She was evaluated by Dr. Layne Benton, orthopedist.  MRI of cervical spine with and without contrast from 04/22/18 demonstrated mild disc bulging at C4-5 and C5-6 without significant stenosis but otherwise unremarkable with no  explanation for symptoms.  MRI of lumbar spine showed small left foraminal disc protrusion at L3-4 possibly irritating the left L3 nerve root, shallow right disc bulge at L4-5 and mild disc bulge at L5-S1 without stenosis. Extensive labs including CBC, ESR, CRP, ANA, RF, anti-CCP, CK, TSH, T3, T4, B12, folate were reportedly unremarkable.  Vitamin D was mildly low.  She has been evaluated by rheumatology who does not suspect an autoimmune disorder.  PAST MEDICAL HISTORY: Past Medical History:  Diagnosis Date  . ADHD (attention deficit hyperactivity disorder)   . Frequent headaches     PAST SURGICAL HISTORY: Past Surgical History:  Procedure Laterality Date  . BREAST SURGERY Bilateral 07/2014   reduction  . CESAREAN SECTION     has had 2  . COSMETIC SURGERY     breast reduction, Sept 2015  . dental implant    . ganlgion cyst Bilateral   . MANDIBLE SURGERY    . NASAL SEPTUM SURGERY  2016  . prk  2005    MEDICATIONS: Current Outpatient Medications on File Prior to Visit  Medication Sig Dispense Refill  . cetirizine (ZYRTEC) 10 MG tablet Take 10 mg by mouth daily.    . clotrimazole-betamethasone (LOTRISONE) cream Apply 1 application topically 2 (two) times daily. (Patient taking differently: Apply 1 application topically 2 (two) times daily as needed. ) 45 g 0  . Multiple Vitamins-Minerals (MULTIVITAMIN WITH MINERALS) tablet Take 1 tablet by mouth daily.    . Probiotic Product (ALIGN PO) Take 1 capsule by mouth daily.    Marland Kitchen  TURMERIC PO Take 700 mg by mouth daily.     No current facility-administered medications on file prior to visit.     ALLERGIES: No Known Allergies  FAMILY HISTORY: Family History  Problem Relation Age of Onset  . Alcohol abuse Father   . Cancer Father        prostate  . Hyperlipidemia Father   . Stroke Father   . Hypertension Father   . Kidney disease Father   . Mental illness Father   . Diabetes Father   . Cancer Maternal Grandmother        breast    . Cancer Paternal Grandmother        breast  . Bipolar disorder Sister   . Mental retardation Brother     SOCIAL HISTORY: Social History   Socioeconomic History  . Marital status: Married    Spouse name: Not on file  . Number of children: 2  . Years of education: Not on file  . Highest education level: Bachelor's degree (e.g., BA, AB, BS)  Occupational History  . Occupation: Cabin crew  Social Needs  . Financial resource strain: Not on file  . Food insecurity:    Worry: Not on file    Inability: Not on file  . Transportation needs:    Medical: Not on file    Non-medical: Not on file  Tobacco Use  . Smoking status: Never Smoker  . Smokeless tobacco: Never Used  Substance and Sexual Activity  . Alcohol use: Yes    Comment: once monthly  . Drug use: Not Currently    Types: Marijuana    Comment: quit college  . Sexual activity: Yes  Lifestyle  . Physical activity:    Days per week: Not on file    Minutes per session: Not on file  . Stress: Not on file  Relationships  . Social connections:    Talks on phone: Not on file    Gets together: Not on file    Attends religious service: Not on file    Active member of club or organization: Not on file    Attends meetings of clubs or organizations: Not on file    Relationship status: Not on file  . Intimate partner violence:    Fear of current or ex partner: Not on file    Emotionally abused: Not on file    Physically abused: Not on file    Forced sexual activity: Not on file  Other Topics Concern  . Not on file  Social History Narrative   Lives home with husband and 2 kids.  [employer] designer, theatre/television/film (realtor).  Education+  [employer].        Patient is right-handed. She lives with her husband and 2 children. She drinks 1-2 large glasses of 1/2-1/2 tea a day. She works out vigorously 3 x week for over an hour.     REVIEW OF SYSTEMS: Constitutional: No fevers, chills, or sweats, no generalized fatigue, change in  appetite Eyes: No visual changes, double vision, eye pain Ear, nose and throat: No hearing loss, ear pain, nasal congestion, sore throat Cardiovascular: No chest pain, palpitations Respiratory:  No shortness of breath at rest or with exertion, wheezes GastrointestinaI: No nausea, vomiting, diarrhea, abdominal pain, fecal incontinence Genitourinary:  No dysuria, urinary retention or frequency Musculoskeletal:  No neck pain, back pain Integumentary: No rash, pruritus, skin lesions Neurological: as above Psychiatric: No depression, insomnia, anxiety Endocrine: No palpitations, fatigue, diaphoresis, mood swings, change in appetite,  change in weight, increased thirst Hematologic/Lymphatic:  No purpura, petechiae. Allergic/Immunologic: no itchy/runny eyes, nasal congestion, recent allergic reactions, rashes  PHYSICAL EXAM: Vitals:   06/07/18 1359  BP: 98/70  Pulse: 97  SpO2: 97%   General: No acute distress.  Patient appears well-groomed.  Head:  Normocephalic/atraumatic Eyes:  fundi examined but not visualized Neck: supple, no paraspinal tenderness, full range of motion Back: No paraspinal tenderness Heart: regular rate and rhythm Lungs: Clear to auscultation bilaterally. Vascular: No carotid bruits. Neurological Exam: Mental status: alert and oriented to person, place, and time, recent and remote memory intact, fund of knowledge intact, attention and concentration intact, speech fluent and not dysarthric, language intact. Cranial nerves: CN I: not tested CN II: pupils equal, round and reactive to light, visual fields intact CN III, IV, VI:  full range of motion, no nystagmus, no ptosis CN V: facial sensation intact CN VII: upper and lower face symmetric CN VIII: hearing intact CN IX, X: gag intact, uvula midline CN XI: sternocleidomastoid and trapezius muscles intact CN XII: tongue midline Bulk & Tone: normal, no fasciculations. Motor:  Possibly trace weakness in quadriceps  bilaterally.  Otherwise, 5/5 throughout  Sensation:  Endorses reduced pinprick sensation in fingers of both hands, now worse in the right hand; vibration sensation intact.. Deep Tendon Reflexes:  2+ throughout, toes downgoing.  Finger to nose testing:  Without dysmetria.  Heel to shin:  Without dysmetria.  Gait:  Normal station and stride.  Able to turn and walk on heels and toes. Romberg negative.  IMPRESSION: As the MRI of brain/orbits and cervical spine were unremarkable, I have no CNS explanation for her symptoms.  The episode of visual disturbance, while suspicious for optic neuritis, did not reveal any abnormalities of the optic nerve on eye exam or on MRI.  Her symptoms of muscle rigidity and trace weakness may suggest a neuromuscular disease, although exam is overall unremarkable except for questionable trace weakness in the quadriceps.  I have no explanation for numbness in fingers as MRI of cervical spine is unremarkable.  PLAN: 1.  I will obtain the lab results of the tests checked by her orthopedist. 2.  In the meantime, we will check a Lyme titer for completeness.   3.  I will check NCV-EMG of left arm and right leg 4.  Further recommendations pending results.  If testing is unremarkable, then I have no further recommendations.  Thank you for allowing me to take part in the care of this patient.  Metta Clines, DO  CC:  Annye Asa, MD

## 2018-06-08 ENCOUNTER — Other Ambulatory Visit: Payer: 59

## 2018-06-08 DIAGNOSIS — M6281 Muscle weakness (generalized): Secondary | ICD-10-CM

## 2018-06-08 DIAGNOSIS — H538 Other visual disturbances: Secondary | ICD-10-CM | POA: Diagnosis not present

## 2018-06-08 DIAGNOSIS — M6289 Other specified disorders of muscle: Secondary | ICD-10-CM | POA: Diagnosis not present

## 2018-06-08 DIAGNOSIS — R200 Anesthesia of skin: Secondary | ICD-10-CM

## 2018-06-08 DIAGNOSIS — R202 Paresthesia of skin: Secondary | ICD-10-CM

## 2018-06-09 ENCOUNTER — Telehealth: Payer: Self-pay

## 2018-06-09 ENCOUNTER — Ambulatory Visit: Payer: 59 | Admitting: Rheumatology

## 2018-06-09 DIAGNOSIS — M7631 Iliotibial band syndrome, right leg: Secondary | ICD-10-CM | POA: Diagnosis not present

## 2018-06-09 DIAGNOSIS — M7632 Iliotibial band syndrome, left leg: Secondary | ICD-10-CM | POA: Diagnosis not present

## 2018-06-09 LAB — LYME, IGM, EARLY TEST/REFLEX: LYME DISEASE AB, QUANT, IGM: <0.8 index (ref 0.00–0.79)

## 2018-06-09 NOTE — Telephone Encounter (Signed)
Called and LMOVM advising Pt lyme test is negative.

## 2018-06-09 NOTE — Telephone Encounter (Signed)
-----   Message from Pieter Partridge, DO sent at 06/09/2018  5:13 PM EDT ----- Lyme testing is negative

## 2018-06-14 DIAGNOSIS — M7631 Iliotibial band syndrome, right leg: Secondary | ICD-10-CM | POA: Diagnosis not present

## 2018-06-14 DIAGNOSIS — M7632 Iliotibial band syndrome, left leg: Secondary | ICD-10-CM | POA: Diagnosis not present

## 2018-06-16 DIAGNOSIS — M7632 Iliotibial band syndrome, left leg: Secondary | ICD-10-CM | POA: Diagnosis not present

## 2018-06-16 DIAGNOSIS — M7631 Iliotibial band syndrome, right leg: Secondary | ICD-10-CM | POA: Diagnosis not present

## 2018-06-17 ENCOUNTER — Other Ambulatory Visit (INDEPENDENT_AMBULATORY_CARE_PROVIDER_SITE_OTHER): Payer: 59

## 2018-06-17 ENCOUNTER — Telehealth: Payer: Self-pay

## 2018-06-17 DIAGNOSIS — R6889 Other general symptoms and signs: Secondary | ICD-10-CM

## 2018-06-17 LAB — VITAMIN B12: VITAMIN B 12: 552 pg/mL (ref 211–911)

## 2018-06-17 NOTE — Telephone Encounter (Signed)
Dr Tomi Likens reviewed labs and determined that a B12 level was not checked. Called and spoke with Pt, she will come today for the lab.

## 2018-06-20 ENCOUNTER — Telehealth: Payer: Self-pay

## 2018-06-20 NOTE — Telephone Encounter (Signed)
Called and spoke with Pt, advised her of B12 results

## 2018-06-20 NOTE — Telephone Encounter (Signed)
-----   Message from Pieter Partridge, DO sent at 06/20/2018  7:24 AM EDT ----- B12 level is normal

## 2018-06-21 DIAGNOSIS — M7631 Iliotibial band syndrome, right leg: Secondary | ICD-10-CM | POA: Diagnosis not present

## 2018-06-21 DIAGNOSIS — M7632 Iliotibial band syndrome, left leg: Secondary | ICD-10-CM | POA: Diagnosis not present

## 2018-06-23 ENCOUNTER — Ambulatory Visit (INDEPENDENT_AMBULATORY_CARE_PROVIDER_SITE_OTHER): Payer: 59 | Admitting: Neurology

## 2018-06-23 DIAGNOSIS — M6289 Other specified disorders of muscle: Secondary | ICD-10-CM

## 2018-06-23 DIAGNOSIS — R200 Anesthesia of skin: Secondary | ICD-10-CM

## 2018-06-23 DIAGNOSIS — R202 Paresthesia of skin: Secondary | ICD-10-CM

## 2018-06-23 DIAGNOSIS — M6281 Muscle weakness (generalized): Secondary | ICD-10-CM

## 2018-06-23 DIAGNOSIS — M7631 Iliotibial band syndrome, right leg: Secondary | ICD-10-CM | POA: Diagnosis not present

## 2018-06-23 DIAGNOSIS — M7632 Iliotibial band syndrome, left leg: Secondary | ICD-10-CM | POA: Diagnosis not present

## 2018-06-23 NOTE — Procedures (Signed)
[facility]  [place], [place]  [place], [place] [address] Tel: [phone] Fax:  [phone] Test Date:  06/23/2018  Patient: Lori Zavala DOB: April 26, 1976 Physician: Narda Amber, DO  Sex: Female Height: 5\' 3"  Ref Phys: Metta Clines, DO  ID#: [id number] Temp: 33.2C Technician:    Patient Complaints: This is a 42 year old female referred for evaluation of generalized muscle weakness and paresthesias.  NCV & EMG Findings: Extensive electrodiagnostic testing of the left upper extremity and right lower extremity shows:  1. All sensory responses including the left median, left ulnar, right sural, and right superficial peroneal nerves are within normal limits. 2. All motor responses including the left median, left ulnar, right peroneal, and right tibial nerves are within normal limits. 3. Right tibial H reflex study is within normal limits. 4. There is no evidence of active or chronic motor axon loss changes affecting any of the tested muscles. Motor unit configuration and recruitment pattern is within normal limits.  Impression: This is a normal study.  In particular, there is no evidence of a diffuse myopathy or sensorimotor polyneuropathy.   ___________________________ Narda Amber, DO    Nerve Conduction Studies Anti Sensory Summary Table   Site NR Peak (ms) Norm Peak (ms) P-T Amp (V) Norm P-T Amp  Left Median Anti Sensory (2nd Digit)  Wrist    3.3 <3.4 28.7 >20  Right Sup Peroneal Anti Sensory (Ant Lat Mall)  12 cm    2.4 <4.5 14.2 >5  Right Sural Anti Sensory (Lat Mall)  Calf    2.8 <4.5 10.5 >5  Left Ulnar Anti Sensory (5th Digit)  Wrist    2.9 <3.1 27.7 >12   Motor Summary Table   Site NR Onset (ms) Norm Onset (ms) O-P Amp (mV) Norm O-P Amp Site1 Site2 Delta-0 (ms) Dist (cm) Vel (m/s) Norm Vel (m/s)  Left Median Motor (Abd Poll Brev)  Wrist    3.6 <3.9 7.8 >6 Elbow Wrist 4.1 26.5 65 >50  Elbow    7.7  7.6         Right Peroneal Motor (Ext Dig  Brev)  Ankle    2.7 <5.5 5.4 >3 B Fib Ankle 6.1 35.0 57 >40  B Fib    8.8  5.0  Poplt B Fib 1.1 8.0 73 >40  Poplt    9.9  4.9         Right Peroneal TA Motor (Tib Ant)  Fib Head    1.8 <4.0 7.8 >4 Poplit Fib Head 1.2 8.0 67 >40  Poplit    3.0  7.2         Right Tibial Motor (Abd Hall Brev)  Ankle    4.1 <6.0 8.1 >8 Knee Ankle 5.8 36.0 62 >40  Knee    9.9  4.2         Left Ulnar Motor (Abd Dig Minimi)  Wrist    2.4 <3.1 9.1 >7 B Elbow Wrist 3.1 23.0 74 >50  B Elbow    5.5  8.8  A Elbow B Elbow 1.5 10.0 67 >50  A Elbow    7.0  8.8          H Reflex Studies   NR H-Lat (ms) Lat Norm (ms) L-R H-Lat (ms)  Right Tibial (Gastroc)     29.66 <35    EMG   Side Muscle Ins Act Fibs Psw Fasc Number Recrt Dur Dur. Amp Amp. Poly Poly. Comment  Right AntTibialis Nml Nml Nml Nml  Nml Nml Nml Nml Nml Nml Nml Nml N/A  Right Gastroc Nml Nml Nml Nml Nml Nml Nml Nml Nml Nml Nml Nml N/A  Right Flex Dig Long Nml Nml Nml Nml Nml Nml Nml Nml Nml Nml Nml Nml N/A  Right RectFemoris Nml Nml Nml Nml Nml Nml Nml Nml Nml Nml Nml Nml N/A  Right GluteusMed Nml Nml Nml Nml Nml Nml Nml Nml Nml Nml Nml Nml N/A  Left 1stDorInt Nml Nml Nml Nml Nml Nml Nml Nml Nml Nml Nml Nml N/A  Left PronatorTeres Nml Nml Nml Nml Nml Nml Nml Nml Nml Nml Nml Nml N/A  Left Biceps Nml Nml Nml Nml Nml Nml Nml Nml Nml Nml Nml Nml N/A  Left Triceps Nml Nml Nml Nml Nml Nml Nml Nml Nml Nml Nml Nml N/A  Left Deltoid Nml Nml Nml Nml Nml Nml Nml Nml Nml Nml Nml Nml N/A      Waveforms:

## 2018-06-24 ENCOUNTER — Telehealth: Payer: Self-pay

## 2018-06-24 ENCOUNTER — Encounter: Payer: Self-pay | Admitting: Neurology

## 2018-06-24 ENCOUNTER — Telehealth: Payer: Self-pay | Admitting: Neurology

## 2018-06-24 NOTE — Telephone Encounter (Signed)
Patient called and said that she is needing to check on her results. Please Call. Thanks

## 2018-06-24 NOTE — Telephone Encounter (Signed)
Called and spoke with Pt, advised her of EMG results. Seh wanted to know what our next recommendation was. I advised her since all testing has been normal, there are no further recommendations.

## 2018-06-24 NOTE — Telephone Encounter (Signed)
-----   Message from Pieter Partridge, DO sent at 06/24/2018  6:11 AM EDT ----- EMG is normal.  I have no neurologic explanation for her symptoms.

## 2018-06-28 ENCOUNTER — Encounter: Payer: Self-pay | Admitting: Neurology

## 2018-06-28 DIAGNOSIS — M7632 Iliotibial band syndrome, left leg: Secondary | ICD-10-CM | POA: Diagnosis not present

## 2018-06-28 DIAGNOSIS — M7631 Iliotibial band syndrome, right leg: Secondary | ICD-10-CM | POA: Diagnosis not present

## 2018-07-03 DIAGNOSIS — M25562 Pain in left knee: Secondary | ICD-10-CM | POA: Diagnosis not present

## 2018-07-07 ENCOUNTER — Encounter: Payer: Self-pay | Admitting: Family Medicine

## 2018-07-07 DIAGNOSIS — M6281 Muscle weakness (generalized): Secondary | ICD-10-CM

## 2018-07-07 DIAGNOSIS — M6289 Other specified disorders of muscle: Secondary | ICD-10-CM

## 2018-07-07 DIAGNOSIS — H538 Other visual disturbances: Secondary | ICD-10-CM

## 2018-07-08 ENCOUNTER — Ambulatory Visit: Payer: Self-pay | Admitting: Psychology

## 2018-07-12 DIAGNOSIS — M7631 Iliotibial band syndrome, right leg: Secondary | ICD-10-CM | POA: Diagnosis not present

## 2018-07-12 DIAGNOSIS — M7632 Iliotibial band syndrome, left leg: Secondary | ICD-10-CM | POA: Diagnosis not present

## 2018-07-14 DIAGNOSIS — M7632 Iliotibial band syndrome, left leg: Secondary | ICD-10-CM | POA: Diagnosis not present

## 2018-07-14 DIAGNOSIS — M7631 Iliotibial band syndrome, right leg: Secondary | ICD-10-CM | POA: Diagnosis not present

## 2018-07-15 DIAGNOSIS — M791 Myalgia, unspecified site: Secondary | ICD-10-CM | POA: Diagnosis not present

## 2018-07-15 DIAGNOSIS — M545 Low back pain: Secondary | ICD-10-CM | POA: Diagnosis not present

## 2018-07-19 ENCOUNTER — Other Ambulatory Visit (HOSPITAL_COMMUNITY)
Admission: RE | Admit: 2018-07-19 | Discharge: 2018-07-19 | Disposition: A | Discharge status: Home / Self Care | Payer: 59 | Source: Ambulatory Visit | Attending: Family Medicine | Admitting: Family Medicine

## 2018-07-19 ENCOUNTER — Encounter: Payer: Self-pay | Admitting: Family Medicine

## 2018-07-19 ENCOUNTER — Other Ambulatory Visit: Payer: Self-pay

## 2018-07-19 ENCOUNTER — Ambulatory Visit (INDEPENDENT_AMBULATORY_CARE_PROVIDER_SITE_OTHER): Payer: 59 | Admitting: Family Medicine

## 2018-07-19 VITALS — BP 101/81 | HR 89 | Temp 99.2°F | Resp 16 | Ht 63.0 in | Wt 206.5 lb

## 2018-07-19 DIAGNOSIS — M7632 Iliotibial band syndrome, left leg: Secondary | ICD-10-CM | POA: Diagnosis not present

## 2018-07-19 DIAGNOSIS — Z124 Encounter for screening for malignant neoplasm of cervix: Secondary | ICD-10-CM | POA: Insufficient documentation

## 2018-07-19 DIAGNOSIS — Z0000 Encounter for general adult medical examination without abnormal findings: Secondary | ICD-10-CM | POA: Diagnosis not present

## 2018-07-19 DIAGNOSIS — E669 Obesity, unspecified: Secondary | ICD-10-CM

## 2018-07-19 DIAGNOSIS — Z1231 Encounter for screening mammogram for malignant neoplasm of breast: Secondary | ICD-10-CM | POA: Diagnosis not present

## 2018-07-19 DIAGNOSIS — M7631 Iliotibial band syndrome, right leg: Secondary | ICD-10-CM | POA: Diagnosis not present

## 2018-07-19 LAB — BASIC METABOLIC PANEL
BUN: 13 mg/dL (ref 6–23)
CALCIUM: 9.4 mg/dL (ref 8.4–10.5)
CO2: 27 meq/L (ref 19–32)
CREATININE: 0.86 mg/dL (ref 0.40–1.20)
Chloride: 105 mEq/L (ref 96–112)
GFR: 77.14 mL/min (ref >60.00)
Glucose, Bld: 87 mg/dL (ref 70–99)
Potassium: 4.5 mEq/L (ref 3.5–5.1)
SODIUM: 138 meq/L (ref 135–145)

## 2018-07-19 LAB — CBC WITH DIFFERENTIAL/PLATELET
BASOS PCT: 0.5 % (ref 0.0–3.0)
Basophils Absolute: 0 10*3/uL (ref 0.0–0.1)
EOS ABS: 0.1 10*3/uL (ref 0.0–0.7)
Eosinophils Relative: 1.4 % (ref 0.0–5.0)
HEMATOCRIT: 38.9 % (ref 36.0–46.0)
HEMOGLOBIN: 13.2 g/dL (ref 12.0–15.0)
LYMPHS PCT: 29.8 % (ref 12.0–46.0)
Lymphs Abs: 2.1 10*3/uL (ref 0.7–4.0)
MCHC: 34 g/dL (ref 30.0–36.0)
MCV: 86.7 fl (ref 78.0–100.0)
Monocytes Absolute: 0.5 10*3/uL (ref 0.1–1.0)
Monocytes Relative: 7 % (ref 3.0–12.0)
Neutro Abs: 4.2 10*3/uL (ref 1.4–7.7)
Neutrophils Relative %: 61.3 % (ref 43.0–77.0)
Platelets: 251 10*3/uL (ref 150.0–400.0)
RBC: 4.48 Mil/uL (ref 3.87–5.11)
RDW: 13 % (ref 11.5–15.5)
WBC: 6.9 10*3/uL (ref 4.0–10.5)

## 2018-07-19 LAB — HEPATIC FUNCTION PANEL
ALT: 21 U/L (ref 0–35)
AST: 21 U/L (ref 0–37)
Albumin: 4 g/dL (ref 3.5–5.2)
Alkaline Phosphatase: 43 U/L (ref 39–117)
BILIRUBIN DIRECT: 0.1 mg/dL (ref 0.0–0.3)
BILIRUBIN TOTAL: 0.6 mg/dL (ref 0.2–1.2)
Total Protein: 6.6 g/dL (ref 6.0–8.3)

## 2018-07-19 LAB — LIPID PANEL
Cholesterol: 161 mg/dL (ref 0–200)
HDL: 46.2 mg/dL (ref >39.00)
LDL Cholesterol: 92 mg/dL (ref 0–99)
NONHDL: 114.85
Total CHOL/HDL Ratio: 3
Triglycerides: 114 mg/dL (ref 0.0–149.0)
VLDL: 22.8 mg/dL (ref 0.0–40.0)

## 2018-07-19 LAB — TSH: TSH: 1.81 u[IU]/mL (ref 0.35–4.50)

## 2018-07-19 LAB — VITAMIN D 25 HYDROXY (VIT D DEFICIENCY, FRACTURES): VITD: 28.26 ng/mL — ABNORMAL LOW (ref 30.00–100.00)

## 2018-07-19 NOTE — Assessment & Plan Note (Signed)
Pt's PE WNL w/ exception of obesity.  Due for mammo- ordered.  Pap done today.  Check labs.  Anticipatory guidance provided.

## 2018-07-19 NOTE — Progress Notes (Signed)
   Subjective:    Patient ID: MIKU UDALL, female    DOB: [birth year], 42 y.o.   MRN: [id number]  HPI CPE- UTD on Tdap, declines flu.  Due for mammo   Review of Systems Patient reports no vision/ hearing changes, adenopathy,fever, weight change,  persistant/recurrent hoarseness, swallowing issues, chest pain, palpitations, edema, persistant/recurrent cough, hemoptysis, dyspnea (rest/exertional/paroxysmal nocturnal), gastrointestinal bleeding (melena, rectal bleeding), abdominal pain, significant heartburn, bowel changes, GU symptoms (dysuria, hematuria, incontinence), Gyn symptoms (abnormal  bleeding, pain),  syncope, focal weakness, memory loss, numbness & tingling, skin/hair/nail changes, abnormal bruising or bleeding, anxiety, or depression.     Objective:   Physical Exam  General Appearance:    Alert, cooperative, no distress, appears stated age  Head:    Normocephalic, without obvious abnormality, atraumatic  Eyes:    PERRL, conjunctiva/corneas clear, EOM's intact, fundi    benign, both eyes  Ears:    Normal TM's and external ear canals, both ears  Nose:   Nares normal, septum midline, mucosa normal, no drainage    or sinus tenderness  Throat:   Lips, mucosa, and tongue normal; teeth and gums normal  Neck:   Supple, symmetrical, trachea midline, no adenopathy;    Thyroid: no enlargement/tenderness/nodules  Back:     Symmetric, no curvature, ROM normal, no CVA tenderness  Lungs:     Clear to auscultation bilaterally, respirations unlabored  Chest Wall:    No tenderness or deformity   Heart:    Regular rate and rhythm, S1 and S2 normal, no murmur, rub   or gallop  Breast Exam:    No tenderness, masses, or nipple abnormality  Abdomen:     Soft, non-tender, bowel sounds active all four quadrants,    no masses, no organomegaly  Genitalia:    External genitalia normal, cervix normal in appearance, no CMT, uterus in normal size and position, adnexa w/out mass or tenderness, mucosa pink  and moist, no lesions or discharge present  Rectal:    Normal external appearance  Extremities:   Extremities normal, atraumatic, no cyanosis or edema  Pulses:   2+ and symmetric all extremities  Skin:   Skin color, texture, turgor normal, no rashes or lesions  Lymph nodes:   Cervical, supraclavicular, and axillary nodes normal  Neurologic:   CNII-XII intact, normal strength, sensation and reflexes    throughout          Assessment & Plan:

## 2018-07-19 NOTE — Patient Instructions (Signed)
Follow up in 6 months to recheck weight loss progress and regroup on the neuro stuff We'll notify you of your lab results and make any changes if needed Continue to work on healthy diet and regular exercise- you're doing great! We'll call you with your mammogram appt Call with any questions or concerns Happy Labor Day! (See you at the pool!)

## 2018-07-19 NOTE — Assessment & Plan Note (Signed)
Ongoing issue.  Pt is still figuring out her neurologic issues but is feeling better since starting Baclofen.  She plans to resume exercising now that she's feeling better.  Encouraged healthy diet.  Will follow.

## 2018-07-20 ENCOUNTER — Other Ambulatory Visit: Payer: Self-pay | Admitting: General Practice

## 2018-07-20 LAB — CYTOLOGY - PAP
Adequacy: ABSENT
Diagnosis: NEGATIVE
HPV (WINDOPATH): NOT DETECTED

## 2018-07-20 MED ORDER — VITAMIN D (ERGOCALCIFEROL) 1.25 MG (50000 UNIT) PO CAPS: 50000.0000 [IU] | ORAL_CAPSULE | ORAL | 0 refills | Status: DC

## 2018-07-21 DIAGNOSIS — M7631 Iliotibial band syndrome, right leg: Secondary | ICD-10-CM | POA: Diagnosis not present

## 2018-07-21 DIAGNOSIS — M7632 Iliotibial band syndrome, left leg: Secondary | ICD-10-CM | POA: Diagnosis not present

## 2018-07-29 ENCOUNTER — Ambulatory Visit: Payer: 59 | Admitting: Psychology

## 2018-07-29 DIAGNOSIS — F411 Generalized anxiety disorder: Secondary | ICD-10-CM | POA: Diagnosis not present

## 2018-08-02 ENCOUNTER — Encounter: Payer: Self-pay | Admitting: Family Medicine

## 2018-08-02 ENCOUNTER — Ambulatory Visit: Payer: Self-pay | Admitting: Neurology

## 2018-08-03 DIAGNOSIS — M7631 Iliotibial band syndrome, right leg: Secondary | ICD-10-CM | POA: Diagnosis not present

## 2018-08-03 DIAGNOSIS — M7632 Iliotibial band syndrome, left leg: Secondary | ICD-10-CM | POA: Diagnosis not present

## 2018-08-03 DIAGNOSIS — M25562 Pain in left knee: Secondary | ICD-10-CM | POA: Diagnosis not present

## 2018-08-29 ENCOUNTER — Encounter: Payer: Self-pay | Admitting: Family Medicine

## 2018-08-29 ENCOUNTER — Encounter: Payer: Self-pay | Admitting: Rheumatology

## 2018-09-02 ENCOUNTER — Ambulatory Visit: Payer: 59 | Admitting: Psychology

## 2018-09-02 DIAGNOSIS — M25562 Pain in left knee: Secondary | ICD-10-CM | POA: Diagnosis not present

## 2018-09-02 DIAGNOSIS — F411 Generalized anxiety disorder: Secondary | ICD-10-CM

## 2018-09-09 ENCOUNTER — Ambulatory Visit (INDEPENDENT_AMBULATORY_CARE_PROVIDER_SITE_OTHER): Payer: 59 | Admitting: Family Medicine

## 2018-09-09 ENCOUNTER — Encounter: Payer: Self-pay | Admitting: Family Medicine

## 2018-09-09 ENCOUNTER — Other Ambulatory Visit: Payer: Self-pay

## 2018-09-09 VITALS — BP 122/78 | HR 76 | Temp 98.1°F | Resp 16 | Ht 63.0 in | Wt 200.5 lb

## 2018-09-09 DIAGNOSIS — B354 Tinea corporis: Secondary | ICD-10-CM | POA: Diagnosis not present

## 2018-09-09 MED ORDER — CLOTRIMAZOLE-BETAMETHASONE 1-0.05 % EX CREA: 1.0000 "application " | TOPICAL_CREAM | Freq: Two times a day (BID) | CUTANEOUS | 0 refills | Status: DC

## 2018-09-09 NOTE — Patient Instructions (Signed)
Follow up as needed or as scheduled START the Lotrisone cream twice daily Call with any questions or concerns Happy Fall!!!

## 2018-09-09 NOTE — Progress Notes (Signed)
   Subjective:    Patient ID: Lori Zavala, female    DOB: 01/04/1976, 42 y.o.   MRN: [id number]  HPI Wrist lesion- dorsum of L wrist.  sxs started ~3 months ago.  Stopped wearing Apple Watch.  Now itching.  Using Hydrocortisone w/o relief.   Review of Systems For ROS see HPI     Objective:   Physical Exam  Constitutional: She appears well-developed and well-nourished. No distress.  Skin: Skin is warm and dry. There is erythema (well demarcated lesion consistent w/ ringworm on dorsum or L wrist).  Psychiatric: She has a normal mood and affect. Her behavior is normal. Thought content normal.  Vitals reviewed.         Assessment & Plan:  Ring worm- new.  Start Lotrisone cream twice daily.  Reviewed supportive care and red flags that should prompt return.  Pt expressed understanding and is in agreement w/ plan.

## 2018-09-15 DIAGNOSIS — M7712 Lateral epicondylitis, left elbow: Secondary | ICD-10-CM | POA: Diagnosis not present

## 2018-09-23 DIAGNOSIS — R531 Weakness: Secondary | ICD-10-CM | POA: Diagnosis not present

## 2018-09-23 DIAGNOSIS — M791 Myalgia, unspecified site: Secondary | ICD-10-CM | POA: Diagnosis not present

## 2018-09-23 DIAGNOSIS — M6289 Other specified disorders of muscle: Secondary | ICD-10-CM | POA: Diagnosis not present

## 2018-09-26 DIAGNOSIS — M25522 Pain in left elbow: Secondary | ICD-10-CM | POA: Diagnosis not present

## 2018-09-30 ENCOUNTER — Ambulatory Visit: Payer: 59 | Admitting: Psychology

## 2018-09-30 DIAGNOSIS — F411 Generalized anxiety disorder: Secondary | ICD-10-CM

## 2018-09-30 DIAGNOSIS — M25522 Pain in left elbow: Secondary | ICD-10-CM | POA: Diagnosis not present

## 2018-10-03 DIAGNOSIS — M25522 Pain in left elbow: Secondary | ICD-10-CM | POA: Diagnosis not present

## 2018-10-07 ENCOUNTER — Other Ambulatory Visit: Payer: Self-pay | Admitting: Family Medicine

## 2018-10-11 DIAGNOSIS — M791 Myalgia, unspecified site: Secondary | ICD-10-CM | POA: Diagnosis not present

## 2018-10-11 DIAGNOSIS — L748 Other eccrine sweat disorders: Secondary | ICD-10-CM | POA: Diagnosis not present

## 2018-10-11 DIAGNOSIS — M6289 Other specified disorders of muscle: Secondary | ICD-10-CM | POA: Diagnosis not present

## 2018-10-11 DIAGNOSIS — R635 Abnormal weight gain: Secondary | ICD-10-CM | POA: Diagnosis not present

## 2018-10-14 DIAGNOSIS — M25522 Pain in left elbow: Secondary | ICD-10-CM | POA: Diagnosis not present

## 2018-10-17 DIAGNOSIS — M25522 Pain in left elbow: Secondary | ICD-10-CM | POA: Diagnosis not present

## 2018-10-24 DIAGNOSIS — M25522 Pain in left elbow: Secondary | ICD-10-CM | POA: Diagnosis not present

## 2018-10-28 DIAGNOSIS — M25522 Pain in left elbow: Secondary | ICD-10-CM | POA: Diagnosis not present

## 2018-10-31 DIAGNOSIS — M25522 Pain in left elbow: Secondary | ICD-10-CM | POA: Diagnosis not present

## 2018-11-04 ENCOUNTER — Ambulatory Visit: Payer: 59 | Admitting: Psychology

## 2018-11-04 DIAGNOSIS — M25522 Pain in left elbow: Secondary | ICD-10-CM | POA: Diagnosis not present

## 2018-11-05 ENCOUNTER — Other Ambulatory Visit: Payer: Self-pay | Admitting: Family Medicine

## 2018-11-09 DIAGNOSIS — M791 Myalgia, unspecified site: Secondary | ICD-10-CM | POA: Diagnosis not present

## 2018-11-09 DIAGNOSIS — M6289 Other specified disorders of muscle: Secondary | ICD-10-CM | POA: Diagnosis not present

## 2018-11-11 DIAGNOSIS — M25522 Pain in left elbow: Secondary | ICD-10-CM | POA: Diagnosis not present

## 2018-12-07 DIAGNOSIS — M25571 Pain in right ankle and joints of right foot: Secondary | ICD-10-CM | POA: Diagnosis not present

## 2018-12-07 DIAGNOSIS — M7712 Lateral epicondylitis, left elbow: Secondary | ICD-10-CM | POA: Diagnosis not present

## 2018-12-07 DIAGNOSIS — M25572 Pain in left ankle and joints of left foot: Secondary | ICD-10-CM | POA: Diagnosis not present

## 2018-12-20 DIAGNOSIS — M6289 Other specified disorders of muscle: Secondary | ICD-10-CM | POA: Diagnosis not present

## 2018-12-20 DIAGNOSIS — M791 Myalgia, unspecified site: Secondary | ICD-10-CM | POA: Diagnosis not present

## 2019-01-11 ENCOUNTER — Ambulatory Visit: Payer: 59 | Admitting: Psychology

## 2019-01-11 DIAGNOSIS — F411 Generalized anxiety disorder: Secondary | ICD-10-CM | POA: Diagnosis not present

## 2019-01-17 ENCOUNTER — Encounter: Payer: Self-pay | Admitting: Family Medicine

## 2019-01-17 ENCOUNTER — Ambulatory Visit: Payer: 59 | Admitting: Family Medicine

## 2019-01-17 ENCOUNTER — Other Ambulatory Visit: Payer: Self-pay

## 2019-01-17 VITALS — BP 110/80 | HR 76 | Temp 98.0°F | Resp 17 | Ht 63.0 in | Wt 185.1 lb

## 2019-01-17 DIAGNOSIS — M6289 Other specified disorders of muscle: Secondary | ICD-10-CM | POA: Diagnosis not present

## 2019-01-17 DIAGNOSIS — E669 Obesity, unspecified: Secondary | ICD-10-CM | POA: Diagnosis not present

## 2019-01-17 NOTE — Assessment & Plan Note (Signed)
Pt is down 8 lbs since last visit.  Applauded her efforts at diet and exercise.  No need for labs at this time.  Will follow.

## 2019-01-17 NOTE — Patient Instructions (Signed)
Schedule your complete physical in 6 months Continue to work on healthy diet and regular exercise- you're doing great! Call with any questions or concerns Hang in there!!

## 2019-01-17 NOTE — Progress Notes (Signed)
   Subjective:    Patient ID: Lori Zavala, female    DOB: 02/09/1976, 43 y.o.   MRN: [id number]  HPI Obesity- pt is down 8 lbs since December.  Pt is now following a Keto diet and continues to exercise regularly.  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  Pt is pleased that she is being heard and 'it's not in my head'.     Review of Systems For ROS see HPI     Objective:   Physical Exam Vitals signs reviewed.  Constitutional:      General: She is not in acute distress.    Appearance: She is well-developed. She is obese.  HENT:     Head: Normocephalic and atraumatic.  Eyes:     Conjunctiva/sclera: Conjunctivae normal.     Pupils: Pupils are equal, round, and reactive to light.  Neck:     Musculoskeletal: Normal range of motion and neck supple.     Thyroid: No thyromegaly.  Cardiovascular:     Rate and Rhythm: Normal rate and regular rhythm.     Heart sounds: Normal heart sounds. No murmur.  Pulmonary:     Effort: Pulmonary effort is normal. No respiratory distress.     Breath sounds: Normal breath sounds.  Abdominal:     General: There is no distension.     Palpations: Abdomen is soft.     Tenderness: There is no abdominal tenderness.  Lymphadenopathy:     Cervical: No cervical adenopathy.  Skin:    General: Skin is warm and dry.  Neurological:     Mental Status: She is alert and oriented to person, place, and time.  Psychiatric:        Behavior: Behavior normal.           Assessment & Plan:

## 2019-01-17 NOTE — Assessment & Plan Note (Signed)
Ongoing issue for pt.  Now following w/ [facility] neuromuscular neurologist.  Gets results of genetic testing next month.

## 2019-02-07 ENCOUNTER — Ambulatory Visit: Payer: 59 | Admitting: Psychology

## 2019-02-13 ENCOUNTER — Encounter (INDEPENDENT_AMBULATORY_CARE_PROVIDER_SITE_OTHER): Payer: 59 | Admitting: Family Medicine

## 2019-02-13 DIAGNOSIS — B370 Candidal stomatitis: Secondary | ICD-10-CM

## 2019-02-13 MED ORDER — NYSTATIN 100000 UNIT/ML MT SUSP: OROMUCOSAL | 0 refills | Status: DC

## 2019-02-13 NOTE — Telephone Encounter (Signed)
Cumulative Time: 6 minutes Consent: Lori Zavala agreed and this is noted above People Involved: Lori Zavala and myself CC: possible thrush HPI: Lori Zavala has recently been on Prednisone and abx for an upper respiratory infection.  She now has thick plaques on tongue that do not brush off AP:  Thrush.  Reviewed Lori Zavala's note and picture and it is consistent w/ oral thrush.  Start Nystatin swish and spit.  Lori Zavala was counseled on how to use.  Will follow up if no improvement.

## 2019-02-24 ENCOUNTER — Encounter: Payer: Self-pay | Admitting: Family Medicine

## 2019-03-06 ENCOUNTER — Encounter: Payer: Self-pay | Admitting: Family Medicine

## 2019-03-13 ENCOUNTER — Encounter: Payer: Self-pay | Admitting: Family Medicine

## 2019-05-25 ENCOUNTER — Ambulatory Visit (INDEPENDENT_AMBULATORY_CARE_PROVIDER_SITE_OTHER): Payer: 59 | Admitting: Psychology

## 2019-05-25 DIAGNOSIS — F411 Generalized anxiety disorder: Secondary | ICD-10-CM | POA: Diagnosis not present

## 2019-07-24 ENCOUNTER — Encounter: Payer: Self-pay | Admitting: Family Medicine

## 2019-11-25 ENCOUNTER — Encounter: Payer: Self-pay | Admitting: Family Medicine

## 2020-01-25 ENCOUNTER — Encounter: Payer: Self-pay | Admitting: Family Medicine

## 2020-01-26 ENCOUNTER — Ambulatory Visit: Payer: 59 | Attending: Internal Medicine

## 2020-01-26 DIAGNOSIS — Z23 Encounter for immunization: Secondary | ICD-10-CM | POA: Insufficient documentation

## 2020-01-26 NOTE — Progress Notes (Signed)
   Covid-19 Vaccination Clinic  Name:  Lori Zavala    MRN: [id number] DOB: May 08, 1976  01/26/2020  [name] was observed post Covid-19 immunization for 15 minutes without incident. She was provided with Vaccine Information Sheet and instruction to access the V-Safe system.   [name] was instructed to call 911 with any severe reactions post vaccine: Marland Kitchen Difficulty breathing  . Swelling of face and throat  . A fast heartbeat  . A bad rash all over body  . Dizziness and weakness

## 2020-02-21 ENCOUNTER — Ambulatory Visit: Payer: 59 | Attending: Internal Medicine

## 2020-02-21 DIAGNOSIS — Z23 Encounter for immunization: Secondary | ICD-10-CM

## 2020-02-21 NOTE — Progress Notes (Signed)
   Covid-19 Vaccination Clinic  Name:  [name]    MRN: [id number] DOB: 26-Feb-1976  02/21/2020  [name] was observed post Covid-19 immunization for 15 minutes without incident. She was provided with Vaccine Information Sheet and instruction to access the V-Safe system.   [name] was instructed to call 911 with any severe reactions post vaccine: Marland Kitchen Difficulty breathing  . Swelling of face and throat  . A fast heartbeat  . A bad rash all over body  . Dizziness and weakness   Immunizations Administered    Name Date Dose VIS Date Route   Pfizer COVID-19 Vaccine 02/21/2020 12:04 PM 0.3 mL 11/03/2019 Intramuscular   Manufacturer: [place]   Lot: [id number]   [place]: [id number]

## 2020-04-03 ENCOUNTER — Encounter: Payer: Self-pay | Admitting: Family Medicine

## 2020-07-23 ENCOUNTER — Other Ambulatory Visit: Payer: Self-pay

## 2020-07-23 ENCOUNTER — Encounter: Payer: Self-pay | Admitting: Family Medicine

## 2020-07-23 ENCOUNTER — Ambulatory Visit (INDEPENDENT_AMBULATORY_CARE_PROVIDER_SITE_OTHER): Payer: 59 | Admitting: Family Medicine

## 2020-07-23 VITALS — BP 112/81 | HR 76 | Temp 97.9°F | Resp 16 | Ht 63.0 in | Wt 179.2 lb

## 2020-07-23 DIAGNOSIS — E669 Obesity, unspecified: Secondary | ICD-10-CM

## 2020-07-23 DIAGNOSIS — E559 Vitamin D deficiency, unspecified: Secondary | ICD-10-CM

## 2020-07-23 DIAGNOSIS — Z0000 Encounter for general adult medical examination without abnormal findings: Secondary | ICD-10-CM

## 2020-07-23 DIAGNOSIS — Z23 Encounter for immunization: Secondary | ICD-10-CM

## 2020-07-23 LAB — LIPID PANEL
Cholesterol: 140 mg/dL (ref 0–200)
HDL: 41.7 mg/dL (ref >39.00)
LDL Cholesterol: 86 mg/dL (ref 0–99)
NonHDL: 98.73
Total CHOL/HDL Ratio: 3
Triglycerides: 65 mg/dL (ref 0.0–149.0)
VLDL: 13 mg/dL (ref 0.0–40.0)

## 2020-07-23 LAB — CBC WITH DIFFERENTIAL/PLATELET
Basophils Absolute: 0 10*3/uL (ref 0.0–0.1)
Basophils Relative: 0.4 % (ref 0.0–3.0)
Eosinophils Absolute: 0.1 10*3/uL (ref 0.0–0.7)
Eosinophils Relative: 1 % (ref 0.0–5.0)
HCT: 37.2 % (ref 36.0–46.0)
Hemoglobin: 12.5 g/dL (ref 12.0–15.0)
Lymphocytes Relative: 30.7 % (ref 12.0–46.0)
Lymphs Abs: 1.7 10*3/uL (ref 0.7–4.0)
MCHC: 33.7 g/dL (ref 30.0–36.0)
MCV: 89.2 fl (ref 78.0–100.0)
Monocytes Absolute: 0.5 10*3/uL (ref 0.1–1.0)
Monocytes Relative: 8.5 % (ref 3.0–12.0)
Neutro Abs: 3.3 10*3/uL (ref 1.4–7.7)
Neutrophils Relative %: 59.4 % (ref 43.0–77.0)
Platelets: 214 10*3/uL (ref 150.0–400.0)
RBC: 4.17 Mil/uL (ref 3.87–5.11)
RDW: 12.9 % (ref 11.5–15.5)
WBC: 5.5 10*3/uL (ref 4.0–10.5)

## 2020-07-23 LAB — HEPATIC FUNCTION PANEL
ALT: 9 U/L (ref 0–35)
AST: 13 U/L (ref 0–37)
Albumin: 4.3 g/dL (ref 3.5–5.2)
Alkaline Phosphatase: 32 U/L — ABNORMAL LOW (ref 39–117)
Bilirubin, Direct: 0.1 mg/dL (ref 0.0–0.3)
Total Bilirubin: 0.5 mg/dL (ref 0.2–1.2)
Total Protein: 6.5 g/dL (ref 6.0–8.3)

## 2020-07-23 LAB — BASIC METABOLIC PANEL
BUN: 17 mg/dL (ref 6–23)
CO2: 26 mEq/L (ref 19–32)
Calcium: 9.2 mg/dL (ref 8.4–10.5)
Chloride: 105 mEq/L (ref 96–112)
Creatinine, Ser: 0.82 mg/dL (ref 0.40–1.20)
GFR: 75.95 mL/min (ref >60.00)
Glucose, Bld: 79 mg/dL (ref 70–99)
Potassium: 4.4 mEq/L (ref 3.5–5.1)
Sodium: 137 mEq/L (ref 135–145)

## 2020-07-23 LAB — TSH: TSH: 2.23 u[IU]/mL (ref 0.35–4.50)

## 2020-07-23 LAB — VITAMIN D 25 HYDROXY (VIT D DEFICIENCY, FRACTURES): VITD: 74.9 ng/mL (ref 30.00–100.00)

## 2020-07-23 MED ORDER — MUPIROCIN 2 % EX OINT: 1.0000 "application " | TOPICAL_OINTMENT | Freq: Two times a day (BID) | CUTANEOUS | 0 refills | Status: DC

## 2020-07-23 NOTE — Patient Instructions (Signed)
Follow up in 1 year or as needed We'll notify you of your lab results and make any changes if needed Continue to work on healthy diet and regular exercise- you're doing great w/ your weight loss! Have Dr Julien Girt send me a copy of your pap and mammo Call with any questions or concerns Stay Safe!  Stay Healthy!

## 2020-07-23 NOTE — Assessment & Plan Note (Signed)
Pt's PE WNL w/ exception of obesity.  UTD on COVID vaccines, Tdap.  Flu shot given today.  Has appt scheduled for pap and will get mammo at that time.  Check labs.  Anticipatory guidance provided.

## 2020-07-23 NOTE — Assessment & Plan Note (Signed)
Pt is down 20+ lbs since her highest weight.  Applauded her efforts and encouraged her to continue.  Check labs to risk stratify.  Will follow.

## 2020-07-23 NOTE — Progress Notes (Signed)
   Subjective:    Patient ID: Lori Zavala, female    DOB: 07/28/1976, 44 y.o.   MRN: [id number]  HPI CPE- UTD on pap, due for mammo.  UTD on COVID vaccines.  Will get flu today.  Pt is down 20 lbs from her highest weight.  Currently doing Optivia   Reviewed past medical, surgical, family and social histories.   Health Maintenance  Topic Date Due  . INFLUENZA VACCINE  06/23/2020  . Hepatitis C Screening  07/23/2021 (Originally 1976-08-26)  . HIV Screening  07/23/2021 (Originally 03/01/1992)  . PAP SMEAR-Modifier  07/19/2021  . TETANUS/TDAP  07/15/2025  . COVID-19 Vaccine  Completed      Review of Systems Patient reports no vision/ hearing changes, adenopathy,fever, weight change,  persistant/recurrent hoarseness , swallowing issues, chest pain, palpitations, edema, persistant/recurrent cough, hemoptysis, dyspnea (rest/exertional/paroxysmal nocturnal), gastrointestinal bleeding (melena, rectal bleeding), abdominal pain, significant heartburn, bowel changes, GU symptoms (dysuria, hematuria, incontinence), Gyn symptoms (abnormal  bleeding, pain),  syncope, memory loss, numbness & tingling, hair/nail changes, abnormal bruising or bleeding, anxiety, or depression.   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  Safety protocols were in place, including screening questions prior to the visit, additional usage of staff PPE, and extensive cleaning of exam room while observing appropriate contact time as indicated for disinfecting solutions.       Objective:   Physical Exam General Appearance:    Alert, cooperative, no distress, appears stated age  Head:    Normocephalic, without obvious abnormality, atraumatic  Eyes:    PERRL, conjunctiva/corneas clear, EOM's intact, fundi    benign, both eyes  Ears:    Normal TM's and external ear canals, both ears    Nose:   Deferred due to COVID  Throat:   Neck:   Supple, symmetrical, trachea midline, no adenopathy;    Thyroid: no enlargement/tenderness/nodules  Back:     Symmetric, no curvature, ROM normal, no CVA tenderness  Lungs:     Clear to auscultation bilaterally, respirations unlabored  Chest Wall:    No tenderness or deformity   Heart:    Regular rate and rhythm, S1 and S2 normal, no murmur, rub   or gallop  Breast Exam:    Deferred to GYN  Abdomen:     Soft, non-tender, bowel sounds active all four quadrants,    no masses, no organomegaly  Genitalia:    Deferred to GYN  Rectal:    Extremities:   Extremities normal, atraumatic, no cyanosis or edema  Pulses:   2+ and symmetric all extremities  Skin:   Skin color, texture, turgor normal, no rashes or lesions  Lymph nodes:   Cervical, supraclavicular, and axillary nodes normal  Neurologic:   CNII-XII intact, normal strength, sensation and reflexes    throughout          Assessment & Plan:

## 2020-07-24 ENCOUNTER — Encounter: Payer: Self-pay | Admitting: General Practice

## 2020-07-25 ENCOUNTER — Encounter: Payer: Self-pay | Admitting: Family Medicine

## 2020-07-30 DIAGNOSIS — G43909 Migraine, unspecified, not intractable, without status migrainosus: Secondary | ICD-10-CM | POA: Insufficient documentation

## 2020-07-30 DIAGNOSIS — K589 Irritable bowel syndrome without diarrhea: Secondary | ICD-10-CM | POA: Insufficient documentation

## 2020-08-13 ENCOUNTER — Ambulatory Visit (INDEPENDENT_AMBULATORY_CARE_PROVIDER_SITE_OTHER): Payer: 59 | Admitting: Psychology

## 2020-08-13 DIAGNOSIS — F411 Generalized anxiety disorder: Secondary | ICD-10-CM

## 2020-09-02 ENCOUNTER — Encounter: Payer: Self-pay | Admitting: Family Medicine

## 2020-09-02 DIAGNOSIS — K130 Diseases of lips: Secondary | ICD-10-CM

## 2020-10-01 ENCOUNTER — Encounter: Payer: Self-pay | Admitting: Family Medicine

## 2020-10-01 ENCOUNTER — Ambulatory Visit (INDEPENDENT_AMBULATORY_CARE_PROVIDER_SITE_OTHER): Payer: 59 | Admitting: Psychology

## 2020-10-01 DIAGNOSIS — F411 Generalized anxiety disorder: Secondary | ICD-10-CM

## 2020-10-03 MED ORDER — AMPHETAMINE-DEXTROAMPHET ER 20 MG PO CP24: 20.0000 mg | ORAL_CAPSULE | Freq: Every day | ORAL | 0 refills | Status: DC

## 2020-11-07 ENCOUNTER — Ambulatory Visit: Payer: 59 | Admitting: Family Medicine

## 2020-11-07 ENCOUNTER — Other Ambulatory Visit: Payer: Self-pay

## 2020-11-07 ENCOUNTER — Encounter: Payer: Self-pay | Admitting: Family Medicine

## 2020-11-07 ENCOUNTER — Other Ambulatory Visit: Payer: Self-pay | Admitting: Family Medicine

## 2020-11-07 VITALS — BP 112/70 | HR 94 | Temp 98.7°F | Resp 16 | Ht 63.0 in | Wt 167.8 lb

## 2020-11-07 DIAGNOSIS — H539 Unspecified visual disturbance: Secondary | ICD-10-CM | POA: Diagnosis not present

## 2020-11-07 DIAGNOSIS — F902 Attention-deficit hyperactivity disorder, combined type: Secondary | ICD-10-CM

## 2020-11-07 NOTE — Assessment & Plan Note (Signed)
Deteriorated.  Pt's sxs had worsened to the point that she reached out via MyChart to resume medication.  Since starting medication, sxs have improved.  She is able to initiate tasks and complete them.  No longer having racing thoughts.  Able to sleep, denies palpitations.  Does report ~30 minutes of feeling jittery but this is not something bothersome enough to switch meds based on the benefit she gets.  No med changes at this time.  Will follow.

## 2020-11-07 NOTE — Progress Notes (Signed)
   Subjective:    Patient ID: Lori Zavala, female    DOB: 12-13-1975, 44 y.o.   MRN: [id number]  HPI ADHD- Pt was started on Adderall XR 20mg  daily.  Pt reports medication is helpful- 'rambling thoughts have stopped'.  Was having analysis paralysis prior to starting medication and is now able to initiate and complete tasks.  Pt reports she will have ~30 minutes of feeling jittery at onset but that resolves and day is productive.  Denies palpitations, insomnia.  Change in vision- L eye went from 20/15 -->20/25 in 2 months.  She is concerned about the dramatic change.  Would like a 2nd opinion.   Review of Systems For ROS see HPI   This visit occurred during the SARS-CoV-2 public health emergency.  Safety protocols were in place, including screening questions prior to the visit, additional usage of staff PPE, and extensive cleaning of exam room while observing appropriate contact time as indicated for disinfecting solutions.       Objective:   Physical Exam Vitals reviewed.  Constitutional:      General: She is not in acute distress.    Appearance: Normal appearance. She is not ill-appearing.  HENT:     Head: Normocephalic and atraumatic.  Eyes:     Extraocular Movements: Extraocular movements intact.     Conjunctiva/sclera: Conjunctivae normal.     Pupils: Pupils are equal, round, and reactive to light.  Cardiovascular:     Rate and Rhythm: Normal rate and regular rhythm.     Pulses: Normal pulses.  Skin:    General: Skin is warm and dry.     Findings: No rash.  Neurological:     General: No focal deficit present.     Mental Status: She is alert and oriented to person, place, and time. Mental status is at baseline.     Cranial Nerves: No cranial nerve deficit.     Sensory: No sensory deficit.     Motor: No weakness.     Coordination: Coordination normal.     Gait: Gait normal.  Psychiatric:        Mood and Affect: Mood normal.        Behavior: Behavior normal.         Thought Content: Thought content normal.           Assessment & Plan:  Visual changes- pt reports she is having issues w/ blurry vision at night and trouble w/ depth perception.  She had her vision checked prior to having issues and it was 20/15 in L eye.  When she developed sxs she went back for a recheck (2 months later) and it was 20/25- tested by the same person.  The dramatic change concerns her and she would like a 2nd opinion.  Referral placed.

## 2020-11-07 NOTE — Patient Instructions (Addendum)
Follow up as needed or for your physical Continue the Adderall daily- I'm so glad it's working!!! We'll call you with your eye appt Call with any questions or concerns Happy Holidays!

## 2020-11-08 MED ORDER — AMPHETAMINE-DEXTROAMPHET ER 20 MG PO CP24: 20.0000 mg | ORAL_CAPSULE | Freq: Every day | ORAL | 0 refills | Status: DC

## 2020-11-08 NOTE — Telephone Encounter (Signed)
LFD 10/03/20 #30 with 0 refills LOV 11/08/20 NOV 07/24/21

## 2020-11-19 ENCOUNTER — Encounter: Payer: Self-pay | Admitting: Family Medicine

## 2020-11-19 DIAGNOSIS — H539 Unspecified visual disturbance: Secondary | ICD-10-CM

## 2020-12-04 ENCOUNTER — Other Ambulatory Visit: Payer: Self-pay | Admitting: Family Medicine

## 2020-12-05 MED ORDER — AMPHETAMINE-DEXTROAMPHET ER 20 MG PO CP24: 20.0000 mg | ORAL_CAPSULE | Freq: Every day | ORAL | 0 refills | Status: DC

## 2020-12-05 NOTE — Telephone Encounter (Signed)
LR: 11-08-2020  Qty: 30 with 0 refills  Last office visit: 11-07-2020 Upcoming appointment: 07-24-2021

## 2021-01-02 ENCOUNTER — Other Ambulatory Visit: Payer: Self-pay | Admitting: Physician Assistant

## 2021-01-02 MED ORDER — AMPHETAMINE-DEXTROAMPHET ER 20 MG PO CP24: 20.0000 mg | ORAL_CAPSULE | Freq: Every day | ORAL | 0 refills | Status: DC

## 2021-01-02 NOTE — Telephone Encounter (Signed)
Last OV- 11/07/20 Last refill: 12/05/20 Disp: 30 tablets [place]: 0

## 2021-02-07 ENCOUNTER — Other Ambulatory Visit: Payer: Self-pay | Admitting: Family Medicine

## 2021-02-07 MED ORDER — AMPHETAMINE-DEXTROAMPHET ER 20 MG PO CP24: 20.0000 mg | ORAL_CAPSULE | Freq: Every day | ORAL | 0 refills | Status: DC

## 2021-02-07 NOTE — Telephone Encounter (Signed)
Last refill: 01/02/21 #30, 0 Last OV: 11/07/20 dx. ADHD

## 2021-03-07 ENCOUNTER — Other Ambulatory Visit: Payer: Self-pay | Admitting: Family Medicine

## 2021-03-10 NOTE — Telephone Encounter (Signed)
Patient is requesting a refill of the following medications: Requested Prescriptions   Pending Prescriptions Disp Refills  . amphetamine-dextroamphetamine (ADDERALL XR) 20 MG 24 hr capsule 30 capsule 0    Sig: Take 1 capsule (20 mg total) by mouth daily.    Date of patient request: 03/07/2021 Last office visit:11/07/2020 Date of last refill: 02/07/2021 Last refill amount: 30 capsules Follow up time period per chart: 07/24/2021

## 2021-03-12 MED ORDER — AMPHETAMINE-DEXTROAMPHET ER 20 MG PO CP24: 20.0000 mg | ORAL_CAPSULE | Freq: Every day | ORAL | 0 refills | Status: DC

## 2021-04-06 ENCOUNTER — Other Ambulatory Visit: Payer: Self-pay | Admitting: Family Medicine

## 2021-04-07 NOTE — Telephone Encounter (Signed)
Patient is requesting a refill of the following medications: Requested Prescriptions   Pending Prescriptions Disp Refills  . amphetamine-dextroamphetamine (ADDERALL XR) 20 MG 24 hr capsule 30 capsule 0    Sig: Take 1 capsule (20 mg total) by mouth daily.    Date of patient request: 04/06/2021 Last office visit: 11/07/2020 Date of last refill: 03/12/2021 Last refill amount: 30 capsules  Follow up time period per chart: 07/24/2021

## 2021-04-08 MED ORDER — AMPHETAMINE-DEXTROAMPHET ER 20 MG PO CP24: 20.0000 mg | ORAL_CAPSULE | Freq: Every day | ORAL | 0 refills | Status: DC

## 2021-04-30 ENCOUNTER — Telehealth (INDEPENDENT_AMBULATORY_CARE_PROVIDER_SITE_OTHER): Payer: 59 | Admitting: Registered Nurse

## 2021-04-30 ENCOUNTER — Encounter: Payer: Self-pay | Admitting: Registered Nurse

## 2021-04-30 ENCOUNTER — Other Ambulatory Visit: Payer: Self-pay

## 2021-04-30 DIAGNOSIS — B9789 Other viral agents as the cause of diseases classified elsewhere: Secondary | ICD-10-CM

## 2021-04-30 DIAGNOSIS — R059 Cough, unspecified: Secondary | ICD-10-CM

## 2021-04-30 DIAGNOSIS — J988 Other specified respiratory disorders: Secondary | ICD-10-CM

## 2021-04-30 MED ORDER — PROMETHAZINE-CODEINE 6.25-10 MG/5ML PO SOLN: 5.0000 mL | Freq: Four times a day (QID) | ORAL | 0 refills | Status: DC | PRN

## 2021-04-30 MED ORDER — PREDNISONE 20 MG PO TABS: 20.0000 mg | ORAL_TABLET | Freq: Every day | ORAL | 0 refills | Status: DC

## 2021-04-30 MED ORDER — BENZONATATE 200 MG PO CAPS: 200.0000 mg | ORAL_CAPSULE | Freq: Two times a day (BID) | ORAL | 0 refills | Status: DC | PRN

## 2021-04-30 MED ORDER — ALBUTEROL SULFATE HFA 108 (90 BASE) MCG/ACT IN AERS
2.0000 | INHALATION_SPRAY | Freq: Four times a day (QID) | RESPIRATORY_TRACT | 2 refills | Status: DC | PRN
Start: 2021-04-30 — End: 2022-03-11

## 2021-04-30 NOTE — Progress Notes (Signed)
Telemedicine Encounter- SOAP NOTE Established Patient  This telephone encounter was conducted with the patient's (or proxy's) verbal consent via audio telecommunications: yes  Patient was instructed to have this encounter in a suitably private space; and to only have persons present to whom they give permission to participate. In addition, patient identity was confirmed by use of name plus two identifiers (DOB and address).  I discussed the limitations, risks, security and privacy concerns of performing an evaluation and management service by telephone and the availability of in person appointments. I also discussed with the patient that there may be a patient responsible charge related to this service. The patient expressed understanding and agreed to proceed.  I spent a total of 15 minutes talking with the patient or their proxy.  Patient at home Provider in office  Participants: Kathrin Ruddy, NP and Berenda Morale  Chief Complaint  Patient presents with  . Cough    Congestion x 5 days    Subjective   Lori Zavala is a 45 y.o. established patient. Telephone visit today for congestion  HPI Onset 5 days ago Worsening, particularly over past 24 hours Taking Vit D, Zinc, Mucinex OTCs helping, but not resolving Negative Covid test x 5 Thinks this is viral rather than bacterial.  Patient Active Problem List   Diagnosis Date Noted  . Irritable bowel syndrome 07/30/2020  . Migraine 07/30/2020  . Myotonia 01/17/2019  . 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 ADHD (attention deficit hyperactivity disorder), combined type 02/17/2016  . Physical exam 07/16/2015  . Obesity (BMI 30-39.9) 05/02/2010  . BACK PAIN, THORACIC REGION 08/05/2009  . SEBACEOUS CYST 04/09/2009  . Anemia 02/06/2008  . CHICKENPOX, HX OF 02/06/2008    Past Medical History:  Diagnosis Date  . ADHD (attention deficit hyperactivity disorder)   . Frequent headaches   . Myotonia congenita, autosomal dominant      Current Outpatient Medications  Medication Sig Dispense Refill  . amphetamine-dextroamphetamine (ADDERALL XR) 20 MG 24 hr capsule Take 1 capsule (20 mg total) by mouth daily. 30 capsule 0  . B Complex Vitamins (B COMPLEX 1 PO) Take by mouth.    . cetirizine (ZYRTEC) 10 MG tablet Take 10 mg by mouth daily.    . Cholecalciferol (VITAMIN D) 125 MCG (5000 UT) CAPS Take by mouth.    . Magnesium 125 MG CAPS Take by mouth. Pt takes 2    . mexiletine (MEXITIL) 150 MG capsule Take 150 mg by mouth 2 (two) times daily.    Marland Kitchen mexiletine (MEXITIL) 200 MG capsule Take 200 mg by mouth daily. Takes with the 150mg  BID    . Multiple Vitamins-Minerals (MULTIVITAMIN WITH MINERALS) tablet Take 1 tablet by mouth daily.    . Probiotic Product (ALIGN PO) Take 1 capsule by mouth daily.    . Taurine (MEGA TAURINE) 1000 MG CAPS Take by mouth.    . TURMERIC PO Take 700 mg by mouth daily.     No current facility-administered medications for this visit.    Allergies  Allergen Reactions  . Succinylcholine     At high risk of malignant hyperthermia due to myotonia congenita    Social History   Socioeconomic History  . Marital status: Married    Spouse name: Not on file  . Number of children: 2  . Years of education: Not on file  . Highest education level: Bachelor's degree (e.g., BA, AB, BS)  Occupational History  . Occupation: realtor  Tobacco Use  . Smoking status:  Never Smoker  . Smokeless tobacco: Never Used  Vaping Use  . Vaping Use: Never used  Substance and Sexual Activity  . Alcohol use: Yes    Comment: once monthly  . Drug use: Not Currently    Types: Marijuana    Comment: quit college  . Sexual activity: Yes  Other Topics Concern  . Not on file  Social History Narrative   Lives home with husband and 2 kids.  [employer] Production designer, theatre/television/film (realtor).  Education+  [employer].        Patient is right-handed. She lives with her husband and 2 children. She drinks 1-2 large glasses of  1/2-1/2 tea a day. She works out vigorously 3 x week for over an hour.    Social Determinants of Health   Financial Resource Strain: Not on file  Food Insecurity: Not on file  Transportation Needs: Not on file  Physical Activity: Not on file  Stress: Not on file  Social Connections: Not on file  Intimate Partner Violence: Not on file    Review of Systems  Constitutional: Negative.   HENT: Positive for congestion. Negative for ear pain, sinus pain and sore throat.   Eyes: Negative.   Respiratory: Positive for cough and shortness of breath.   Cardiovascular: Negative.   Gastrointestinal: Negative.   Genitourinary: Negative.   Musculoskeletal: Negative.   Skin: Negative.   Neurological: Negative.   Endo/Heme/Allergies: Negative.   Psychiatric/Behavioral: Negative.   All other systems reviewed and are negative.   Objective   Vitals as reported by the patient: There were no vitals filed for this visit.  There are no diagnoses linked to this encounter.  PLAN  Supportive care as above  Doubt bacterial infection at this time but concern for developing PNA or other bacterial lower respiratory infection  Low threshold for return to clinic or urgent care  Reviewed nonpharm - rest, hydrate, deep breathing exercises  Patient encouraged to call clinic with any questions, comments, or concerns.  I discussed the assessment and treatment plan with the patient. The patient was provided an opportunity to ask questions and all were answered. The patient agreed with the plan and demonstrated an understanding of the instructions.   The patient was advised to call back or seek an in-person evaluation if the symptoms worsen or if the condition fails to improve as anticipated.  I provided 15 minutes of non-face-to-face time during this encounter.  Maximiano Coss, NP  Primary Care at [facility]

## 2021-05-01 ENCOUNTER — Encounter: Payer: Self-pay | Admitting: Registered Nurse

## 2021-05-09 ENCOUNTER — Other Ambulatory Visit: Payer: Self-pay | Admitting: Family Medicine

## 2021-05-09 ENCOUNTER — Other Ambulatory Visit: Payer: Self-pay | Admitting: Registered Nurse

## 2021-05-09 DIAGNOSIS — B9789 Other viral agents as the cause of diseases classified elsewhere: Secondary | ICD-10-CM

## 2021-05-09 DIAGNOSIS — R059 Cough, unspecified: Secondary | ICD-10-CM

## 2021-05-12 MED ORDER — AMPHETAMINE-DEXTROAMPHET ER 20 MG PO CP24: 20.0000 mg | ORAL_CAPSULE | Freq: Every day | ORAL | 0 refills | Status: DC

## 2021-05-12 MED ORDER — BENZONATATE 200 MG PO CAPS: 200.0000 mg | ORAL_CAPSULE | Freq: Two times a day (BID) | ORAL | 0 refills | Status: DC | PRN

## 2021-05-12 NOTE — Telephone Encounter (Signed)
LFD 04/08/21 #30 with no refills LOV 04/30/21 NOV 07/24/21

## 2021-05-12 NOTE — Telephone Encounter (Signed)
:   Im still coughing from the cold i have.(Note from pharmacy , from patient)  LFD 04/30/21 #20 with no refills. Please advise.

## 2021-05-21 ENCOUNTER — Encounter: Payer: Self-pay | Admitting: *Deleted

## 2021-06-09 ENCOUNTER — Other Ambulatory Visit: Payer: Self-pay | Admitting: Family Medicine

## 2021-06-10 MED ORDER — AMPHETAMINE-DEXTROAMPHET ER 20 MG PO CP24: 20.0000 mg | ORAL_CAPSULE | Freq: Every day | ORAL | 0 refills | Status: DC

## 2021-06-10 NOTE — Telephone Encounter (Signed)
Last filled 05/12/21 #30 with no refills Last visit 04/30/21 for cough Next visit 07/24/21

## 2021-06-30 ENCOUNTER — Ambulatory Visit: Payer: 59 | Admitting: Psychology

## 2021-07-02 ENCOUNTER — Ambulatory Visit (INDEPENDENT_AMBULATORY_CARE_PROVIDER_SITE_OTHER): Payer: 59 | Admitting: Psychology

## 2021-07-02 DIAGNOSIS — F411 Generalized anxiety disorder: Secondary | ICD-10-CM | POA: Diagnosis not present

## 2021-07-14 ENCOUNTER — Telehealth: Payer: Self-pay | Admitting: Family Medicine

## 2021-07-14 ENCOUNTER — Other Ambulatory Visit: Payer: Self-pay

## 2021-07-14 MED ORDER — AMPHETAMINE-DEXTROAMPHET ER 20 MG PO CP24: 20.0000 mg | ORAL_CAPSULE | Freq: Every day | ORAL | 0 refills | Status: DC

## 2021-07-14 NOTE — Telephone Encounter (Signed)
Patient needs her adderall refilled - [facility] [place]

## 2021-07-14 NOTE — Telephone Encounter (Signed)
Patient needs her adderall refilled - [facility] [place]  LFD 06/10/21 #30 with no refills LOV 04/30/21 NOV 09/18/21

## 2021-07-14 NOTE — Telephone Encounter (Signed)
Request sent to provider for approval.  

## 2021-07-24 ENCOUNTER — Encounter: Payer: 59 | Admitting: Family Medicine

## 2021-08-05 ENCOUNTER — Ambulatory Visit (INDEPENDENT_AMBULATORY_CARE_PROVIDER_SITE_OTHER): Payer: 59 | Admitting: Psychology

## 2021-08-05 DIAGNOSIS — F411 Generalized anxiety disorder: Secondary | ICD-10-CM

## 2021-08-13 ENCOUNTER — Other Ambulatory Visit: Payer: Self-pay | Admitting: Obstetrics and Gynecology

## 2021-08-13 DIAGNOSIS — R928 Other abnormal and inconclusive findings on diagnostic imaging of breast: Secondary | ICD-10-CM

## 2021-08-14 ENCOUNTER — Ambulatory Visit
Admission: RE | Admit: 2021-08-14 | Discharge: 2021-08-14 | Disposition: A | Discharge status: Home / Self Care | Payer: 59 | Source: Ambulatory Visit | Attending: Obstetrics and Gynecology | Admitting: Obstetrics and Gynecology

## 2021-08-14 ENCOUNTER — Ambulatory Visit: Payer: Self-pay

## 2021-08-14 ENCOUNTER — Other Ambulatory Visit: Payer: Self-pay

## 2021-08-14 ENCOUNTER — Other Ambulatory Visit: Payer: Self-pay | Admitting: Obstetrics and Gynecology

## 2021-08-14 DIAGNOSIS — R928 Other abnormal and inconclusive findings on diagnostic imaging of breast: Secondary | ICD-10-CM

## 2021-08-14 LAB — HM MAMMOGRAPHY

## 2021-08-14 MED ORDER — AMPHETAMINE-DEXTROAMPHET ER 20 MG PO CP24: 20.0000 mg | ORAL_CAPSULE | Freq: Every day | ORAL | 0 refills | Status: DC

## 2021-08-14 NOTE — Telephone Encounter (Signed)
Last refill: 07/14/21 #30, 0 Last OV: 11/07/20 dx. ADHD

## 2021-08-28 ENCOUNTER — Other Ambulatory Visit: Payer: Self-pay

## 2021-09-05 ENCOUNTER — Ambulatory Visit (INDEPENDENT_AMBULATORY_CARE_PROVIDER_SITE_OTHER): Payer: 59 | Admitting: Psychology

## 2021-09-05 DIAGNOSIS — F411 Generalized anxiety disorder: Secondary | ICD-10-CM | POA: Diagnosis not present

## 2021-09-09 ENCOUNTER — Other Ambulatory Visit: Payer: Self-pay | Admitting: Family Medicine

## 2021-09-10 MED ORDER — AMPHETAMINE-DEXTROAMPHET ER 20 MG PO CP24: 20.0000 mg | ORAL_CAPSULE | Freq: Every day | ORAL | 0 refills | Status: DC

## 2021-09-18 ENCOUNTER — Ambulatory Visit (INDEPENDENT_AMBULATORY_CARE_PROVIDER_SITE_OTHER): Payer: 59 | Admitting: Family Medicine

## 2021-09-18 ENCOUNTER — Other Ambulatory Visit: Payer: Self-pay

## 2021-09-18 ENCOUNTER — Encounter: Payer: Self-pay | Admitting: Family Medicine

## 2021-09-18 VITALS — BP 121/80 | HR 90 | Temp 99.3°F | Resp 20 | Ht 63.0 in | Wt 180.4 lb

## 2021-09-18 DIAGNOSIS — Z0000 Encounter for general adult medical examination without abnormal findings: Secondary | ICD-10-CM | POA: Diagnosis not present

## 2021-09-18 DIAGNOSIS — E669 Obesity, unspecified: Secondary | ICD-10-CM

## 2021-09-18 DIAGNOSIS — E559 Vitamin D deficiency, unspecified: Secondary | ICD-10-CM

## 2021-09-18 LAB — CBC WITH DIFFERENTIAL/PLATELET
Basophils Absolute: 0 10*3/uL (ref 0.0–0.1)
Basophils Relative: 0.7 % (ref 0.0–3.0)
Eosinophils Absolute: 0.4 10*3/uL (ref 0.0–0.7)
Eosinophils Relative: 8.1 % — ABNORMAL HIGH (ref 0.0–5.0)
HCT: 38.3 % (ref 36.0–46.0)
Hemoglobin: 12.9 g/dL (ref 12.0–15.0)
Lymphocytes Relative: 35.9 % (ref 12.0–46.0)
Lymphs Abs: 1.9 10*3/uL (ref 0.7–4.0)
MCHC: 33.6 g/dL (ref 30.0–36.0)
MCV: 88.7 fl (ref 78.0–100.0)
Monocytes Absolute: 0.4 10*3/uL (ref 0.1–1.0)
Monocytes Relative: 7.6 % (ref 3.0–12.0)
Neutro Abs: 2.5 10*3/uL (ref 1.4–7.7)
Neutrophils Relative %: 47.7 % (ref 43.0–77.0)
Platelets: 242 10*3/uL (ref 150.0–400.0)
RBC: 4.32 Mil/uL (ref 3.87–5.11)
RDW: 12.8 % (ref 11.5–15.5)
WBC: 5.2 10*3/uL (ref 4.0–10.5)

## 2021-09-18 LAB — TSH: TSH: 1.81 u[IU]/mL (ref 0.35–5.50)

## 2021-09-18 LAB — HEPATIC FUNCTION PANEL
ALT: 19 U/L (ref 0–35)
AST: 18 U/L (ref 0–37)
Albumin: 4.3 g/dL (ref 3.5–5.2)
Alkaline Phosphatase: 37 U/L — ABNORMAL LOW (ref 39–117)
Bilirubin, Direct: 0.1 mg/dL (ref 0.0–0.3)
Total Bilirubin: 0.5 mg/dL (ref 0.2–1.2)
Total Protein: 6.6 g/dL (ref 6.0–8.3)

## 2021-09-18 LAB — LIPID PANEL
Cholesterol: 174 mg/dL (ref 0–200)
HDL: 59.6 mg/dL (ref >39.00)
LDL Cholesterol: 92 mg/dL (ref 0–99)
NonHDL: 114.38
Total CHOL/HDL Ratio: 3
Triglycerides: 114 mg/dL (ref 0.0–149.0)
VLDL: 22.8 mg/dL (ref 0.0–40.0)

## 2021-09-18 LAB — BASIC METABOLIC PANEL
BUN: 15 mg/dL (ref 6–23)
CO2: 29 mEq/L (ref 19–32)
Calcium: 8.9 mg/dL (ref 8.4–10.5)
Chloride: 103 mEq/L (ref 96–112)
Creatinine, Ser: 0.87 mg/dL (ref 0.40–1.20)
GFR: 80.96 mL/min (ref >60.00)
Glucose, Bld: 79 mg/dL (ref 70–99)
Potassium: 4.1 mEq/L (ref 3.5–5.1)
Sodium: 139 mEq/L (ref 135–145)

## 2021-09-18 LAB — VITAMIN D 25 HYDROXY (VIT D DEFICIENCY, FRACTURES): VITD: 69.19 ng/mL (ref 30.00–100.00)

## 2021-09-18 NOTE — Assessment & Plan Note (Signed)
Pt's PE WNL w/ exception of obesity.  UTD on pap, mammo.  UTD on Tdap, COVID.  Declines flu.  Check labs.  Anticipatory guidance provided.

## 2021-09-18 NOTE — Patient Instructions (Signed)
Follow up in 1 year or as needed We'll notify you of your lab results and make any changes if needed Continue to work on healthy diet and regular exercise- you can do it! Experiment w/ the herbs and vitamins to improve Adderall Call with any questions or concerns Stay Safe!  Stay Healthy! Happy Halloween!

## 2021-09-18 NOTE — Progress Notes (Signed)
   Subjective:    Patient ID: Lori Zavala, female    DOB: Oct 02, 1976, 45 y.o.   MRN: [id number]  HPI CPE- UTD on pap, mammo, Tdap.  Due for flu shot.  Health Maintenance  Topic Date Due   Pneumococcal Vaccine 50-47 Years old (1 - PCV) Never done   PAP SMEAR-Modifier  07/19/2021   INFLUENZA VACCINE  09/19/2021 (Originally 06/23/2021)   Hepatitis C Screening  09/18/2022 (Originally 03/02/1995)   HIV Screening  09/18/2022 (Originally 03/01/1992)   COVID-19 Vaccine (4 - Booster for Pfizer series) 10/17/2021   TETANUS/TDAP  07/15/2025   HPV VACCINES  Aged Out    ADHD- pt wants to stop Adderall and prefers to do more natural options.  Found a guy on TikTok who recommends herbals and supplements.   Review of Systems Patient reports no hearing changes, adenopathy,fever, persistant/recurrent hoarseness , swallowing issues, chest pain, palpitations, edema, persistant/recurrent cough, hemoptysis, dyspnea (rest/exertional/paroxysmal nocturnal), gastrointestinal bleeding (melena, rectal bleeding), abdominal pain, significant heartburn, bowel changes, GU symptoms (dysuria, hematuria, incontinence), Gyn symptoms (abnormal  bleeding, pain),  syncope, focal weakness, memory loss, numbness & tingling, skin/hair/nail changes, abnormal bruising or bleeding, anxiety, or depression.   + visual changes- new glasses, has been to [facility]  This visit occurred during the SARS-CoV-2 public health emergency.  Safety protocols were in place, including screening questions prior to the visit, additional usage of staff PPE, and extensive cleaning of exam room while observing appropriate contact time as indicated for disinfecting solutions.      Objective:   Physical Exam General Appearance:    Alert, cooperative, no distress, appears stated age  Head:    Normocephalic, without obvious abnormality, atraumatic  Eyes:    PERRL, conjunctiva/corneas clear, EOM's intact, fundi    benign, both eyes  Ears:    Normal TM's and  external ear canals, both ears  Nose:   Deferred due to COVID  Throat:   Neck:   Supple, symmetrical, trachea midline, no adenopathy;    Thyroid: no enlargement/tenderness/nodules  Back:     Symmetric, no curvature, ROM normal, no CVA tenderness  Lungs:     Clear to auscultation bilaterally, respirations unlabored  Chest Wall:    No tenderness or deformity   Heart:    Regular rate and rhythm, S1 and S2 normal, no murmur, rub   or gallop  Breast Exam:    Deferred to GYN  Abdomen:     Soft, non-tender, bowel sounds active all four quadrants,    no masses, no organomegaly  Genitalia:    Deferred to GYN  Rectal:    Extremities:   Extremities normal, atraumatic, no cyanosis or edema  Pulses:   2+ and symmetric all extremities  Skin:   Skin color, texture, turgor normal, no rashes or lesions  Lymph nodes:   Cervical, supraclavicular, and axillary nodes normal  Neurologic:   CNII-XII intact, normal strength, sensation and reflexes    throughout          Assessment & Plan:

## 2021-09-18 NOTE — Assessment & Plan Note (Signed)
Pt has gained 12 lbs since last visit.  Encouraged healthy diet and regular exercise.  Will check labs to risk stratify.

## 2021-09-30 ENCOUNTER — Encounter: Payer: Self-pay | Admitting: Family Medicine

## 2021-10-03 ENCOUNTER — Ambulatory Visit (INDEPENDENT_AMBULATORY_CARE_PROVIDER_SITE_OTHER): Payer: 59 | Admitting: Psychology

## 2021-10-03 DIAGNOSIS — F411 Generalized anxiety disorder: Secondary | ICD-10-CM | POA: Diagnosis not present

## 2021-10-07 ENCOUNTER — Other Ambulatory Visit: Payer: Self-pay | Admitting: Family Medicine

## 2021-10-08 MED ORDER — AMPHETAMINE-DEXTROAMPHET ER 20 MG PO CP24: 20.0000 mg | ORAL_CAPSULE | Freq: Every day | ORAL | 0 refills | Status: DC

## 2021-10-22 ENCOUNTER — Encounter: Payer: Self-pay | Admitting: Family Medicine

## 2021-10-22 MED ORDER — CEPHALEXIN 500 MG PO CAPS: 500.0000 mg | ORAL_CAPSULE | Freq: Two times a day (BID) | ORAL | 0 refills | Status: AC

## 2021-10-31 ENCOUNTER — Ambulatory Visit (INDEPENDENT_AMBULATORY_CARE_PROVIDER_SITE_OTHER): Payer: 59 | Admitting: Psychology

## 2021-10-31 DIAGNOSIS — F411 Generalized anxiety disorder: Secondary | ICD-10-CM | POA: Diagnosis not present

## 2021-10-31 NOTE — Progress Notes (Signed)
Treatment Plan Date: 10/31/2021  Diagnosis F41.1 (Generalized anxiety disorder) [n/a]  Symptoms Anxiety related to perceived or actual job jeopardy. (Status: maintained) -- No Description Entered  Excessive and/or unrealistic worry that is difficult to control occurring more days than not for at least 6 months about a number of events or activities. (Status: maintained) -- No Description Entered  Medication Status compliance  Safety none  If Suicidal or Homicidal State Action Taken: unspecified  Current Risk: low Medications unspecified Objectives Related Problem: Increase sense of self-esteem and elevation of mood in spite of unemployment. Description: Learn and implement calming skills to reduce overall anxiety and manage anxiety symptoms. Target Date: 2022-02-27 Frequency: Daily Modality: individual Progress: 40%  Related Problem: Increase sense of self-esteem and elevation of mood in spite of unemployment. Description: Identify and replace distorted cognitive messages associated with feelings of job stress. Target Date: 2022-02-27 Frequency: Daily Modality: individual Progress: 40%  Related Problem: Increase sense of self-esteem and elevation of mood in spite of unemployment. Description: Describe the nature and history of the vocational stress. Target Date: 2022-02-27 Frequency: Daily Modality: individual Progress: 50%  Related Problem: Resolve the core conflict that is the source of anxiety. Description: Identify, challenge, and replace biased, fearful self-talk with positive, realistic, and empowering self-talk. Target Date: 2022-02-27 Frequency: Daily Modality: individual Progress: 30%  Related Problem: Resolve the core conflict that is the source of anxiety. Description: Learn and implement calming skills to reduce overall anxiety and manage anxiety symptoms. Target Date: 2022-02-27 Frequency: Daily Modality: individual Progress: 60%  Related Problem: Resolve  the core conflict that is the source of anxiety. Description: Describe situations, thoughts, feelings, and actions associated with anxieties and worries, their impact on functioning, and attempts to resolve them. Target Date: 2022-02-27 Frequency: Daily Modality: individual Progress: 70%  Client Response full compliance  Service Location Location, [address] [place], [place] [address]  Service Code cpt [id number]  Related past to present  Facilitate problem solving  Identify automatic thoughts  Identify/label emotions  Validate/empathize  Self care activities  Lifestyle change (exercise, nutrition)  Self-monitoring  Comments  F 41.1  Goals: Patient is seeking to reduce her anxiety associated significant conflict in her family of origin. In addition, she has had considerable distress related to an undiagnosed physical injury that has compromised her participation in physical activity. Finally, she is struggling to define her professional path. she has had multiple careers, but is currently not satisfies and is seeking to find fulfilling employment. Needs help in identifying what is important for her to consider. Goal date is 12-22   Patient agreed to have a Webex video appointment due to the Coronavirus. She is at her home and I am at my home office.   Lori Zavala says that "the last she heard", her sister was in the ER for dehydration, weight loss, nausea and mal-nutrition. This was communicated to her by her mother. Lori Zavala has not talked with her sister in years. She does communicate with her mother, primarily for the benefit of her children. She feels that the relationship with her mother is very superficial. Plans for Christmas is uncertain. Lori Zavala is thinking this is the year to get together with mother and sister. This is because her sister is so ill and her priority is to do and demonstrate the "right thing" for her children.  Lori Zavala says the only issue for her right now is that she and  her husband are having a difficult time. They are seeing Clint Bolder for  marital counseling. There have been some good insights and they plan to continue in treatment. Lori Zavala feels that part of the issue between them is that she no longer brings in an income. She was making significant salary in the past and the significant reduction in family income caused significant stress for Lori Zavala. His work stress is significant and she believes he needs to look for a new position.  Lori Zavala says "I need to get to a better place". The fact that she is not working is creating significant discomfort. 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 It pays $130.00/day, so she applied for the position. 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 She thinks that having the sub teaching job gives her a psychological edge in that she will at least bring some money into the household.     [name], PhD

## 2021-11-18 ENCOUNTER — Other Ambulatory Visit: Payer: Self-pay | Admitting: Family Medicine

## 2021-11-18 MED ORDER — AMPHETAMINE-DEXTROAMPHET ER 20 MG PO CP24: 20.0000 mg | ORAL_CAPSULE | Freq: Every day | ORAL | 0 refills | Status: DC

## 2021-11-19 ENCOUNTER — Ambulatory Visit (INDEPENDENT_AMBULATORY_CARE_PROVIDER_SITE_OTHER): Payer: 59 | Admitting: Psychology

## 2021-11-19 ENCOUNTER — Telehealth: Payer: Self-pay

## 2021-11-19 ENCOUNTER — Other Ambulatory Visit: Payer: 59

## 2021-11-19 ENCOUNTER — Other Ambulatory Visit: Payer: Self-pay

## 2021-11-19 DIAGNOSIS — Z111 Encounter for screening for respiratory tuberculosis: Secondary | ICD-10-CM

## 2021-11-19 DIAGNOSIS — F411 Generalized anxiety disorder: Secondary | ICD-10-CM | POA: Diagnosis not present

## 2021-11-19 NOTE — Telephone Encounter (Signed)
Caller name:Sudie Roark   On DPR? :Yes  Call back [place]  Provider they see: Birdie Riddle   Reason for call:Pt dropped off form to be completed Health Examination Certificate form placed in Dr Lacie Scotts

## 2021-11-19 NOTE — Telephone Encounter (Signed)
Caller name:Lori Zavala   On DPR? :Yes  Call back [place]  Provider they see: Birdie Riddle   Reason for call:Last CPR 10/22 and needs TB test she wants blood work ordered can a order be placed for this so I can schedule a lab
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
## 2021-11-19 NOTE — Telephone Encounter (Signed)
Form received and placed in your folder

## 2021-11-19 NOTE — Telephone Encounter (Signed)
Lab ordered, can schedule a lab only visit

## 2021-11-21 LAB — QUANTIFERON-TB GOLD PLUS
Mitogen-NIL: >10 IU/mL
NIL: 0.03 IU/mL
QuantiFERON-TB Gold Plus: NEGATIVE
TB1-NIL: 0.01 IU/mL
TB2-NIL: 0.01 IU/mL

## 2021-11-27 NOTE — Telephone Encounter (Signed)
Called patient and let her know the forms would be ready tomorrow morning

## 2021-11-27 NOTE — Telephone Encounter (Signed)
Please advise . Pt would like update on forms

## 2021-11-27 NOTE — Telephone Encounter (Signed)
Pt called in asking for an update on the forms, please advise

## 2021-11-28 ENCOUNTER — Ambulatory Visit: Payer: 59 | Admitting: Psychology

## 2021-12-10 ENCOUNTER — Telehealth: Payer: Self-pay | Admitting: Family Medicine

## 2021-12-10 DIAGNOSIS — T7840XA Allergy, unspecified, initial encounter: Secondary | ICD-10-CM

## 2021-12-10 MED ORDER — EPINEPHRINE 0.3 MG/0.3ML IJ SOAJ: 0.3000 mg | Freq: Once | INTRAMUSCULAR | Status: AC

## 2021-12-10 NOTE — Telephone Encounter (Signed)
Pt called in asking if Tabori would call her in a epi pen? She said she has an appt at [facility] on 01/16/22 they suggested that she call her pcp to get a script   Please advise   She had a reaction to something but isn't sure what it was

## 2021-12-10 NOTE — Telephone Encounter (Signed)
Ok to send epipen 2 pack for pt to use in case of anaphylaxis

## 2021-12-10 NOTE — Telephone Encounter (Signed)
Pt informed

## 2021-12-10 NOTE — Telephone Encounter (Signed)
sent 

## 2021-12-10 NOTE — Telephone Encounter (Signed)
Pt requesting Epi Pen autoinjector be ordered for her due to reaction to unknown allergen. No previous history of epipen please advise if it is okay to order

## 2021-12-14 ENCOUNTER — Other Ambulatory Visit: Payer: Self-pay | Admitting: Family Medicine

## 2021-12-15 MED ORDER — AMPHETAMINE-DEXTROAMPHET ER 20 MG PO CP24: 20.0000 mg | ORAL_CAPSULE | Freq: Every day | ORAL | 0 refills | Status: DC

## 2021-12-16 ENCOUNTER — Other Ambulatory Visit: Payer: Self-pay

## 2021-12-16 MED ORDER — EPINEPHRINE 0.3 MG/0.3ML IJ SOAJ
0.3000 mg | INTRAMUSCULAR | 1 refills | Status: AC | PRN
Start: 2021-12-16 — End: ?

## 2021-12-17 ENCOUNTER — Encounter: Payer: Self-pay | Admitting: Family Medicine

## 2021-12-18 MED ORDER — AMPHETAMINE-DEXTROAMPHETAMINE 20 MG PO TABS: 20.0000 mg | ORAL_TABLET | Freq: Two times a day (BID) | ORAL | 0 refills | Status: DC

## 2021-12-18 NOTE — Telephone Encounter (Signed)
Pt is asking if she can have IR adderall due to shortage or if this is not advisable.

## 2021-12-19 ENCOUNTER — Other Ambulatory Visit: Payer: Self-pay | Admitting: Family Medicine

## 2021-12-19 MED ORDER — AMPHETAMINE-DEXTROAMPHET ER 25 MG PO CP24: 25.0000 mg | ORAL_CAPSULE | ORAL | 0 refills | Status: DC

## 2021-12-19 NOTE — Progress Notes (Signed)
Prescription temporarily increased to 25mg  daily due to Adderall shortage.

## 2021-12-26 ENCOUNTER — Ambulatory Visit (INDEPENDENT_AMBULATORY_CARE_PROVIDER_SITE_OTHER): Payer: 59 | Admitting: Psychology

## 2021-12-26 DIAGNOSIS — F411 Generalized anxiety disorder: Secondary | ICD-10-CM
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
## 2022-01-10 ENCOUNTER — Other Ambulatory Visit: Payer: Self-pay | Admitting: Family Medicine

## 2022-01-12 MED ORDER — AMPHETAMINE-DEXTROAMPHET ER 20 MG PO CP24: 20.0000 mg | ORAL_CAPSULE | Freq: Every day | ORAL | 0 refills | Status: DC

## 2022-01-16 ENCOUNTER — Telehealth: Payer: Self-pay | Admitting: Family Medicine

## 2022-01-16 MED ORDER — AMPHETAMINE-DEXTROAMPHET ER 25 MG PO CP24: 25.0000 mg | ORAL_CAPSULE | ORAL | 0 refills | Status: DC

## 2022-01-16 NOTE — Telephone Encounter (Signed)
Prescription sent for 25mg  XR

## 2022-01-16 NOTE — Telephone Encounter (Signed)
Pt called in stating that [facility] is out of the Adderall 20 mgs, she was told they have the Adderall 25mg  in stock. She wanted to know if Lori Zavala would call in that milligram for her.  [facility]     Please advise

## 2022-01-23 ENCOUNTER — Ambulatory Visit: Payer: 59 | Admitting: Psychology

## 2022-01-26 ENCOUNTER — Encounter: Payer: Self-pay | Admitting: Internal Medicine

## 2022-01-27 ENCOUNTER — Ambulatory Visit (INDEPENDENT_AMBULATORY_CARE_PROVIDER_SITE_OTHER): Payer: 59 | Admitting: Psychology

## 2022-01-27 DIAGNOSIS — F411 Generalized anxiety disorder: Secondary | ICD-10-CM
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
## 2022-02-12 ENCOUNTER — Other Ambulatory Visit: Payer: Self-pay | Admitting: Family Medicine

## 2022-02-13 MED ORDER — AMPHETAMINE-DEXTROAMPHET ER 20 MG PO CP24: 20.0000 mg | ORAL_CAPSULE | Freq: Every day | ORAL | 0 refills | Status: DC

## 2022-02-16 ENCOUNTER — Encounter: Payer: Self-pay | Admitting: Family Medicine

## 2022-02-17 MED ORDER — AMPHETAMINE-DEXTROAMPHET ER 10 MG PO CP24: 20.0000 mg | ORAL_CAPSULE | Freq: Every day | ORAL | 0 refills | Status: DC

## 2022-02-20 ENCOUNTER — Ambulatory Visit: Payer: 59 | Admitting: Psychology

## 2022-02-20 ENCOUNTER — Encounter: Payer: Self-pay | Admitting: *Deleted

## 2022-03-11 ENCOUNTER — Encounter: Payer: Self-pay | Admitting: Internal Medicine

## 2022-03-11 ENCOUNTER — Ambulatory Visit (INDEPENDENT_AMBULATORY_CARE_PROVIDER_SITE_OTHER): Payer: BC Managed Care – PPO | Admitting: Internal Medicine

## 2022-03-11 ENCOUNTER — Ambulatory Visit (INDEPENDENT_AMBULATORY_CARE_PROVIDER_SITE_OTHER): Payer: BC Managed Care – PPO | Admitting: Psychology

## 2022-03-11 VITALS — BP 110/72 | HR 105 | Ht 63.0 in | Wt 183.0 lb

## 2022-03-11 DIAGNOSIS — Z1211 Encounter for screening for malignant neoplasm of colon: Secondary | ICD-10-CM

## 2022-03-11 DIAGNOSIS — K580 Irritable bowel syndrome with diarrhea: Secondary | ICD-10-CM

## 2022-03-11 DIAGNOSIS — F411 Generalized anxiety disorder: Secondary | ICD-10-CM | POA: Diagnosis not present

## 2022-03-11 MED ORDER — DICYCLOMINE HCL 20 MG PO TABS
20.0000 mg | ORAL_TABLET | Freq: Three times a day (TID) | ORAL | 1 refills | Status: DC
Start: 2022-03-11 — End: 2022-04-02

## 2022-03-11 MED ORDER — RIFAXIMIN 550 MG PO TABS: 550.0000 mg | ORAL_TABLET | Freq: Three times a day (TID) | ORAL | 0 refills | Status: DC

## 2022-03-11 NOTE — Progress Notes (Signed)
Patient ID: Lori Zavala, female   DOB: 11-26-1975, 46 y.o.   MRN: [id number] ?HPI: ?Lori Zavala is a 46 year old female with a past medical history of IBS with loose stools/diarrhea, myotonia congenita, anxiety, headaches and ADHD who is seen in consult at the request of Dr. Harold Hedge to evaluate IBS.  She is here today with her husband. ? ?She reports that she was diagnosed by [facility] neurology with myotonia congenita.  She was happy to reach this diagnosis as she had seen multiple other prior neurologist, endocrinologist and rheumatologist. ? ?From a GI perspective she reports lifelong "IBS" with diarrhea.  She recalls this dating back to her teenage years.  Worsened by stress and anxiety.  She reports that she always had loperamide with her and used it frequently.  Prior to January her IBS was infrequent and not a daily problem for her.  She had been on probiotic with good response. ? ?However in January 2023 she got sick with a cold initially but then had fairly diffuse B type symptoms without fever.  She became exhausted feeling and eventually developed IBS type symptoms.  Lower crampy abdominal pain and bloating.  Very slow recovery.  Fatigue and exhaustion.  Intermittent nausea and vomiting.  She tried to reintroduce probiotics but seem to worsen her symptoms.  Bowel habits have been more loose than she had been accustomed to occurring 1-3 times per day.  Not nocturnal.  Not bloody or melenic.  Increased gas.  Crampy mid and left lower abdominal pain.  No issues with frequent heartburn.  No report of dysphagia.  Weight is slightly up for her. ? ?She has tried to reduce gluten intake but not completely eliminated.  She does seem to feel better without the processed wheats. ? ?She had alpha gal and celiac testing by Dr. Harold Hedge which was negative. ? ?Past Medical History:  ?Diagnosis Date  ? ADHD (attention deficit hyperactivity disorder)   ? Frequent headaches   ? GAD (generalized anxiety disorder)   ?  Myotonia congenita, autosomal dominant   ? ? ?Past Surgical History:  ?Procedure Laterality Date  ? BREAST SURGERY Bilateral 07/2014  ? reduction  ? CESAREAN SECTION    ? has had 2  ? COSMETIC SURGERY    ? breast reduction, Sept 2015  ? dental implant    ? ganlgion cyst Bilateral   ? MANDIBLE SURGERY    ? NASAL SEPTUM SURGERY  2016  ? prk  2005  ? REDUCTION MAMMAPLASTY Bilateral   ? 2015  ? ? ?Outpatient Medications Prior to Visit  ?Medication Sig Dispense Refill  ? Cholecalciferol (VITAMIN D) 125 MCG (5000 UT) CAPS Take by mouth.    ? EPINEPHrine (EPIPEN 2-PAK) 0.3 mg/0.3 mL IJ SOAJ injection Inject 0.3 mg into the muscle as needed for anaphylaxis. 1 each 1  ? fexofenadine (ALLEGRA) 180 MG tablet Take 180 mg by mouth daily.    ? Magnesium 125 MG CAPS Take by mouth. Pt takes 2    ? mexiletine (MEXITIL) 150 MG capsule Take 150 mg by mouth 2 (two) times daily.    ? mexiletine (MEXITIL) 200 MG capsule Take 200 mg by mouth daily. Takes with the '150mg'$  BID    ? Taurine (MEGA TAURINE) 1000 MG CAPS Take by mouth.    ? TURMERIC PO Take 700 mg by mouth daily.    ? albuterol (VENTOLIN HFA) 108 (90 Base) MCG/ACT inhaler Inhale 2 puffs into the lungs every 6 (six) hours as needed for  wheezing or shortness of breath. 8 g 2  ? amphetamine-dextroamphetamine (ADDERALL XR) 10 MG 24 hr capsule Take 2 capsules (20 mg total) by mouth daily. 60 capsule 0  ? B Complex Vitamins (B COMPLEX 1 PO) Take by mouth.    ? benzonatate (TESSALON) 200 MG capsule Take 1 capsule (200 mg total) by mouth 2 (two) times daily as needed for cough. 20 capsule 0  ? cetirizine (ZYRTEC) 10 MG tablet Take 10 mg by mouth daily.    ? Multiple Vitamins-Minerals (MULTIVITAMIN WITH MINERALS) tablet Take 1 tablet by mouth daily.    ? predniSONE (DELTASONE) 20 MG tablet Take 1 tablet (20 mg total) by mouth daily with breakfast. 5 tablet 0  ? Probiotic Product (ALIGN PO) Take 1 capsule by mouth daily.    ? Promethazine-Codeine 6.25-10 MG/5ML SOLN Take 5 mLs by mouth  every 6 (six) hours as needed. 280 mL 0  ? ?Facility-Administered Medications Prior to Visit  ?Medication Dose Route Frequency Provider Last Rate Last Admin  ? EPINEPHrine (EPI-PEN) injection 0.3 mg  0.3 mg Subcutaneous Once Midge Minium, MD      ? ? ?Allergies  ?Allergen Reactions  ? Succinylcholine   ?  At high risk of malignant hyperthermia due to myotonia congenita  ? ? ?Family History  ?Problem Relation Age of Onset  ? Alcohol abuse Father   ? Cancer Father   ?     prostate  ? Hyperlipidemia Father   ? Stroke Father   ? Hypertension Father   ? Kidney disease Father   ? Mental illness Father   ? Diabetes Father   ? Bipolar disorder Sister   ? Breast cancer Maternal Aunt 45  ? Breast cancer Paternal Aunt 20  ?     dx'd 3 diff times  ? Cancer Maternal Grandmother   ?     breast  ? Cancer Paternal Grandmother   ?     breast  ? Mental retardation Brother   ? ? ?Social History  ? ?Tobacco Use  ? Smoking status: Never  ? Smokeless tobacco: Never  ?Vaping Use  ? Vaping Use: Never used  ?Substance Use Topics  ? Alcohol use: Yes  ?  Comment: once monthly  ? Drug use: Not Currently  ?  Types: Marijuana  ?  Comment: quit college  ? ? ?ROS: ?As per history of present illness, otherwise negative ? ?BP 110/72   Pulse (!) 105   Ht '5\' 3"'$  (1.6 m)   Wt 183 lb (83 kg)   LMP  (LMP Unknown)   BMI 32.42 kg/m?  ?Gen: awake, alert, NAD ?HEENT: anicteric ?CV: RRR, no mrg ?Pulm: CTA b/l ?Abd: soft, diffuse tenderness without rebound or guarding, nondistended, +BS throughout ?Ext: no c/c/e ?Neuro: nonfocal ? ? ?RELEVANT LABS AND IMAGING: ?CBC ?   ?Component Value Date/Time  ? WBC 5.2 09/18/2021 1057  ? RBC 4.32 09/18/2021 1057  ? HGB 12.9 09/18/2021 1057  ? HCT 38.3 09/18/2021 1057  ? PLT 242.0 09/18/2021 1057  ? MCV 88.7 09/18/2021 1057  ? MCH 29.2 08/31/2016 1519  ? MCHC 33.6 09/18/2021 1057  ? RDW 12.8 09/18/2021 1057  ? LYMPHSABS 1.9 09/18/2021 1057  ? MONOABS 0.4 09/18/2021 1057  ? EOSABS 0.4 09/18/2021 1057  ? BASOSABS  0.0 09/18/2021 1057  ? ? ?CMP  ?   ?Component Value Date/Time  ? NA 139 09/18/2021 1057  ? K 4.1 09/18/2021 1057  ? CL 103 09/18/2021 1057  ? CO2  29 09/18/2021 1057  ? GLUCOSE 79 09/18/2021 1057  ? BUN 15 09/18/2021 1057  ? CREATININE 0.87 09/18/2021 1057  ? CREATININE 0.92 08/31/2016 1519  ? CALCIUM 8.9 09/18/2021 1057  ? PROT 6.6 09/18/2021 1057  ? ALBUMIN 4.3 09/18/2021 1057  ? AST 18 09/18/2021 1057  ? ALT 19 09/18/2021 1057  ? ALKPHOS 37 (L) 09/18/2021 1057  ? BILITOT 0.5 09/18/2021 1057  ? GFRNONAA 87.82 02/18/2010 0914  ? ?Very recent blood work on 02/09/2022 done at [facility], reviewed by care everywhere ? ?ASSESSMENT/PLAN: ?46 year old female with a past medical history of IBS with loose stools/diarrhea, myotonia congenita, anxiety, headaches and ADHD who is seen in consult at the request of Dr. Harold Hedge to evaluate IBS. ? ?IBS-D with postinfectious exacerbation --her symptoms are consistent with IBS.  She had an infectious process which also affected other family members in January 2023.  This was never documented to be COVID but sounds suspicious for it.  We talked about IBS today and how infections can disrupt the gut microbiome leading to irritable bowel type symptoms.  I recommended the following: ?--Rifaximin 550 mg 3 times daily x14 days for IBS-D ?--Bentyl 20 mg 3 times daily as needed ?--I asked that she let me know how symptoms are doing about a month from now ?--Celiac and alpha gal testing negative; I cannot exclude gluten sensitivity which we discussed. ? ?2.  Colon cancer screening --age 30 now; screening colonoscopy recommended.  We reviewed the risk, benefits and alternatives and she is agreeable and wishes to proceed ?--Colonoscopy in late June or July for screening in the [facility] ? ?3.  Myotonia congenita --treated and followed by Dr. Earle Gell at [facility] ? ? ? ? ? ?[id number], Aundra Millet, Md ?[phone] ?[place],  [place] [address] ? ? ?

## 2022-03-11 NOTE — Patient Instructions (Addendum)
It has been recommended to you by your physician that you have a(n) Colonoscopy  completed. Per your request, we did not schedule the procedure(s) today. Please contact our office at [phone] should you decide to have the procedure completed.  ? ?We have sent the following medications to your pharmacy for you to pick up at your convenience: ?Xifaxan , Bentyl  ? ?If you are age 45 or older, your body mass index should be between 23-30. Your Body mass index is 32.42 kg/m?Marland Kitchen If this is out of the aforementioned range listed, please consider follow up with your Primary Care Provider. ? ?If you are age 46 or younger, your body mass index should be between 19-25. Your Body mass index is 32.42 kg/m?Marland Kitchen If this is out of the aformentioned range listed, please consider follow up with your Primary Care Provider.  ? ?________________________________________________________ ? ?The [facility] providers would like to encourage you to use [facility] to communicate with providers for non-urgent requests or questions.  Due to long hold times on the telephone, sending your provider a message by [facility] may be a faster and more efficient way to get a response.  Please allow 48 business hours for a response.  Please remember that this is for non-urgent requests.  ?_______________________________________________________ ? ?Thank you for choosing me and [facility]. ? ? ? ?
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
## 2022-03-20 ENCOUNTER — Ambulatory Visit: Payer: 59 | Admitting: Psychology

## 2022-04-02 ENCOUNTER — Other Ambulatory Visit: Payer: Self-pay | Admitting: Internal Medicine

## 2022-04-17 ENCOUNTER — Encounter: Payer: Self-pay | Admitting: Internal Medicine

## 2022-04-19 ENCOUNTER — Other Ambulatory Visit: Payer: Self-pay | Admitting: Internal Medicine

## 2022-04-22 ENCOUNTER — Ambulatory Visit (INDEPENDENT_AMBULATORY_CARE_PROVIDER_SITE_OTHER): Payer: BC Managed Care – PPO | Admitting: Psychology

## 2022-04-22 DIAGNOSIS — F411 Generalized anxiety disorder: Secondary | ICD-10-CM | POA: Diagnosis not present
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
## 2022-05-04 ENCOUNTER — Encounter: Payer: Self-pay | Admitting: Family Medicine

## 2022-05-04 MED ORDER — DYANAVEL XR 5 MG PO CHER: 1.0000 | CHEWABLE_EXTENDED_RELEASE_TABLET | Freq: Every day | ORAL | 0 refills | Status: DC

## 2022-05-06 ENCOUNTER — Telehealth: Payer: Self-pay

## 2022-05-06 NOTE — Telephone Encounter (Signed)
Form has been faxed waiting on response

## 2022-05-06 NOTE — Telephone Encounter (Signed)
Form signed and returned

## 2022-05-18 NOTE — Telephone Encounter (Signed)
Spoke w/ Lori Zavala and advised I got her Dyanavel 5 mg has been approved from 05/18/22 until 05/18/25 . Annice Pih from [facility] pushed it thru because [employer] sent my PA to them .

## 2022-05-22 ENCOUNTER — Other Ambulatory Visit: Payer: Self-pay

## 2022-05-22 ENCOUNTER — Ambulatory Visit: Payer: 59 | Admitting: Family Medicine

## 2022-05-22 ENCOUNTER — Ambulatory Visit (AMBULATORY_SURGERY_CENTER): Payer: Self-pay

## 2022-05-22 ENCOUNTER — Telehealth: Payer: Self-pay | Admitting: *Deleted

## 2022-05-22 VITALS — Ht 63.0 in | Wt 191.4 lb

## 2022-05-22 DIAGNOSIS — Z1211 Encounter for screening for malignant neoplasm of colon: Secondary | ICD-10-CM

## 2022-05-22 MED ORDER — NA SULFATE-K SULFATE-MG SULF 17.5-3.13-1.6 GM/177ML PO SOLN: 1.0000 | Freq: Once | ORAL | 0 refills | Status: AC

## 2022-05-22 NOTE — Telephone Encounter (Signed)
Will review and discuss with Osvaldo Angst, CRNA regarding anesthesia for L[facility] procedure The main risk appears to be with general anesthetics and paralytics which we will not be using. Sedation plan will be propofol only.

## 2022-05-22 NOTE — Telephone Encounter (Signed)
Dr.Pyrtle and Osvaldo Angst, CRNA,   Please review patient's chart. 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 Okay for colonoscopy at [facility]? Please advise. Thank you, Leylany Nored PV

## 2022-05-22 NOTE — Progress Notes (Signed)
Denies allergies to eggs or soy products. Denies complication of anesthesia or sedation. Denies use of weight loss medication. Denies use of O2.   Emmi instructions given for colonoscopy.  

## 2022-05-25 ENCOUNTER — Ambulatory Visit: Payer: 59 | Admitting: Psychology

## 2022-05-25 NOTE — Telephone Encounter (Signed)
Spoke with the patient and gave her John's recommendation's that the colon will need to be done at the hospital. Pt aware colon at [facility] cancelled and that the office nurse would call her with appointment information.

## 2022-05-27 NOTE — Telephone Encounter (Signed)
Lori Zavala Will need hospital procedure(s) given her history of myotonia congenita JMP

## 2022-05-29 NOTE — Telephone Encounter (Signed)
Screening colon added to the hospital book.

## 2022-06-09 ENCOUNTER — Encounter: Payer: Self-pay | Admitting: Internal Medicine

## 2022-06-12 ENCOUNTER — Ambulatory Visit: Payer: 59 | Admitting: Family Medicine

## 2022-06-15 ENCOUNTER — Encounter: Payer: Self-pay | Admitting: Family Medicine

## 2022-06-15 ENCOUNTER — Other Ambulatory Visit: Payer: Self-pay

## 2022-06-15 ENCOUNTER — Ambulatory Visit (INDEPENDENT_AMBULATORY_CARE_PROVIDER_SITE_OTHER): Payer: BC Managed Care – PPO | Admitting: Family Medicine

## 2022-06-15 VITALS — BP 118/68 | HR 83 | Temp 97.6°F | Resp 18 | Ht 63.0 in | Wt 189.6 lb

## 2022-06-15 DIAGNOSIS — E669 Obesity, unspecified: Secondary | ICD-10-CM

## 2022-06-15 DIAGNOSIS — F902 Attention-deficit hyperactivity disorder, combined type: Secondary | ICD-10-CM | POA: Diagnosis not present

## 2022-06-15 MED ORDER — DYANAVEL XR 5 MG PO CHER: 1.0000 | CHEWABLE_EXTENDED_RELEASE_TABLET | Freq: Every day | ORAL | 0 refills | Status: DC

## 2022-06-15 NOTE — Patient Instructions (Signed)
Follow up as needed or as scheduled No med changes at this time- I'm so glad it's working!!! I'm glad you are back to healthy eating! Call with any questions or concerns Enjoy the rest of your summer!!!

## 2022-06-15 NOTE — Progress Notes (Signed)
   Subjective:    Patient ID: Lori Zavala, female    DOB: 1976/09/03, 46 y.o.   MRN: [id number]  HPI ADHD- 6 weeks ago started Dyanavel XR '5mg'$  daily bc/ she was having issues w/ Adderall shortage and medication not lasting long enough.  Adderall XR would wear off by 4pm.  'love it'  'not having jitters at the front and the crash at the end'  Obesity- pt has resumed eating 5 small meals daily like she was doing on the [place].  Applauded her efforts   Review of Systems For ROS see HPI     Objective:   Physical Exam Vitals reviewed.  Constitutional:      General: She is not in acute distress.    Appearance: Normal appearance. She is obese. She is not ill-appearing.  HENT:     Head: Normocephalic and atraumatic.  Cardiovascular:     Rate and Rhythm: Normal rate and regular rhythm.     Pulses: Normal pulses.  Pulmonary:     Effort: Pulmonary effort is normal. No respiratory distress.     Breath sounds: No wheezing.  Musculoskeletal:     Right lower leg: No edema.     Left lower leg: No edema.  Skin:    General: Skin is warm and dry.  Neurological:     General: No focal deficit present.     Mental Status: She is alert and oriented to person, place, and time.  Psychiatric:        Mood and Affect: Mood normal.        Behavior: Behavior normal.        Thought Content: Thought content normal.           Assessment & Plan:

## 2022-06-15 NOTE — Assessment & Plan Note (Signed)
Ongoing issue.  She has recommitted herself to healthy diet and eating 5 small meals daily.  Applauded her efforts.  Will follow.

## 2022-06-15 NOTE — Assessment & Plan Note (Signed)
Sxs are much improved on the Dyanavel XR '5mg'$  daily.  She doesn't feel jittery when she takes it and doesn't have an afternoon crash.  Pt reports sxs are well controlled and she is not interested in making any changes at this time.  Will follow.

## 2022-06-16 ENCOUNTER — Encounter: Payer: 59 | Admitting: Internal Medicine

## 2022-06-24 ENCOUNTER — Ambulatory Visit (INDEPENDENT_AMBULATORY_CARE_PROVIDER_SITE_OTHER): Payer: BC Managed Care – PPO | Admitting: Psychology

## 2022-06-24 DIAGNOSIS — F411 Generalized anxiety disorder: Secondary | ICD-10-CM
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
## 2022-07-15 ENCOUNTER — Other Ambulatory Visit: Payer: Self-pay | Admitting: Family Medicine

## 2022-07-16 MED ORDER — DYANAVEL XR 5 MG PO CHER: 1.0000 | CHEWABLE_EXTENDED_RELEASE_TABLET | Freq: Every day | ORAL | 0 refills | Status: DC

## 2022-08-25 ENCOUNTER — Ambulatory Visit (INDEPENDENT_AMBULATORY_CARE_PROVIDER_SITE_OTHER): Payer: 59 | Admitting: Psychology

## 2022-08-25 DIAGNOSIS — F411 Generalized anxiety disorder: Secondary | ICD-10-CM | POA: Diagnosis not present
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
## 2022-08-26 ENCOUNTER — Telehealth: Payer: Self-pay | Admitting: Family Medicine

## 2022-08-26 NOTE — Telephone Encounter (Signed)
Notified the pt the form is ready for pickup . Placed up front in filing cabinet and she can pick up before 5 .

## 2022-08-26 NOTE — Telephone Encounter (Signed)
Form is completed.  She can come pick up at any time

## 2022-08-26 NOTE — Telephone Encounter (Signed)
Pt calling b/c states that she is checking on the status of her handicapped placard.   [phone] to

## 2022-08-27 LAB — HM PAP SMEAR

## 2022-08-31 ENCOUNTER — Other Ambulatory Visit: Payer: Self-pay | Admitting: Obstetrics and Gynecology

## 2022-08-31 DIAGNOSIS — R928 Other abnormal and inconclusive findings on diagnostic imaging of breast: Secondary | ICD-10-CM

## 2022-09-09 ENCOUNTER — Ambulatory Visit
Admission: RE | Admit: 2022-09-09 | Discharge: 2022-09-09 | Disposition: A | Discharge status: Home / Self Care | Payer: 59 | Source: Ambulatory Visit | Attending: Obstetrics and Gynecology | Admitting: Obstetrics and Gynecology

## 2022-09-09 DIAGNOSIS — R928 Other abnormal and inconclusive findings on diagnostic imaging of breast: Secondary | ICD-10-CM

## 2022-09-14 ENCOUNTER — Other Ambulatory Visit: Payer: Self-pay | Admitting: Family Medicine

## 2022-09-14 ENCOUNTER — Other Ambulatory Visit: Payer: Self-pay | Admitting: Allergy and Immunology

## 2022-09-14 ENCOUNTER — Ambulatory Visit
Admission: RE | Admit: 2022-09-14 | Discharge: 2022-09-14 | Disposition: A | Discharge status: Home / Self Care | Payer: 59 | Source: Ambulatory Visit | Attending: Allergy and Immunology | Admitting: Allergy and Immunology

## 2022-09-14 ENCOUNTER — Encounter: Payer: Self-pay | Admitting: Family Medicine

## 2022-09-14 DIAGNOSIS — R052 Subacute cough: Secondary | ICD-10-CM

## 2022-09-14 MED ORDER — AMPHETAMINE-DEXTROAMPHETAMINE 5 MG PO TABS: 5.0000 mg | ORAL_TABLET | Freq: Every day | ORAL | 0 refills | Status: DC

## 2022-09-14 NOTE — Telephone Encounter (Signed)
Called to inform patient no answer no VM

## 2022-09-14 NOTE — Telephone Encounter (Signed)
Patient called back and was informed.

## 2022-09-14 NOTE — Telephone Encounter (Signed)
Amphetamine 5 mg LOV: 06/15/22 Last Refill:07/16/22 Upcoming appt: 09/21/22

## 2022-09-14 NOTE — Telephone Encounter (Signed)
Encourage patient to contact the pharmacy for refills or they can request refills through [facility]  (Please schedule appointment if patient has not been seen in over a year) 06/15/22   WHAT PHARMACY WOULD THEY LIKE THIS SENT TO: [id number] [place] - [address]  MEDICATION NAME & DOSE: Amphetamine ER 5 mg   NOTES/COMMENTS FROM PATIENT:Pt needs a refill on her medication       Front office please notify patient: It takes 48-72 hours to process rx refill requests Ask patient to call pharmacy to ensure rx is ready before heading there.

## 2022-09-15 ENCOUNTER — Other Ambulatory Visit: Payer: Self-pay

## 2022-09-15 ENCOUNTER — Telehealth: Payer: Self-pay

## 2022-09-15 DIAGNOSIS — Z1211 Encounter for screening for malignant neoplasm of colon: Secondary | ICD-10-CM

## 2022-09-15 MED ORDER — DYANAVEL XR 5 MG PO CHER: 1.0000 | CHEWABLE_EXTENDED_RELEASE_TABLET | Freq: Every day | ORAL | 0 refills | Status: DC

## 2022-09-15 NOTE — Telephone Encounter (Signed)
[facility] stating that the pt no longer takes Adderall

## 2022-09-21 ENCOUNTER — Encounter (HOSPITAL_COMMUNITY): Payer: Self-pay | Admitting: Internal Medicine

## 2022-09-21 ENCOUNTER — Ambulatory Visit (INDEPENDENT_AMBULATORY_CARE_PROVIDER_SITE_OTHER): Payer: 59 | Admitting: Family Medicine

## 2022-09-21 ENCOUNTER — Encounter: Payer: Self-pay | Admitting: Family Medicine

## 2022-09-21 VITALS — BP 124/80 | HR 72 | Temp 97.3°F | Resp 16 | Ht 63.0 in | Wt 191.0 lb

## 2022-09-21 DIAGNOSIS — Z0000 Encounter for general adult medical examination without abnormal findings: Secondary | ICD-10-CM

## 2022-09-21 DIAGNOSIS — E669 Obesity, unspecified: Secondary | ICD-10-CM

## 2022-09-21 LAB — HEPATIC FUNCTION PANEL
ALT: 16 U/L (ref 0–35)
AST: 18 U/L (ref 0–37)
Albumin: 4.2 g/dL (ref 3.5–5.2)
Alkaline Phosphatase: 38 U/L — ABNORMAL LOW (ref 39–117)
Bilirubin, Direct: 0.1 mg/dL (ref 0.0–0.3)
Total Bilirubin: 0.6 mg/dL (ref 0.2–1.2)
Total Protein: 6.6 g/dL (ref 6.0–8.3)

## 2022-09-21 LAB — LIPID PANEL
Cholesterol: 150 mg/dL (ref 0–200)
HDL: 55.2 mg/dL (ref >39.00)
LDL Cholesterol: 81 mg/dL (ref 0–99)
NonHDL: 94.34
Total CHOL/HDL Ratio: 3
Triglycerides: 67 mg/dL (ref 0.0–149.0)
VLDL: 13.4 mg/dL (ref 0.0–40.0)

## 2022-09-21 LAB — CBC WITH DIFFERENTIAL/PLATELET
Basophils Absolute: 0 10*3/uL (ref 0.0–0.1)
Basophils Relative: 0.7 % (ref 0.0–3.0)
Eosinophils Absolute: 0.2 10*3/uL (ref 0.0–0.7)
Eosinophils Relative: 4.4 % (ref 0.0–5.0)
HCT: 37.8 % (ref 36.0–46.0)
Hemoglobin: 12.7 g/dL (ref 12.0–15.0)
Lymphocytes Relative: 37.5 % (ref 12.0–46.0)
Lymphs Abs: 2.1 10*3/uL (ref 0.7–4.0)
MCHC: 33.7 g/dL (ref 30.0–36.0)
MCV: 91 fl (ref 78.0–100.0)
Monocytes Absolute: 0.5 10*3/uL (ref 0.1–1.0)
Monocytes Relative: 8.3 % (ref 3.0–12.0)
Neutro Abs: 2.7 10*3/uL (ref 1.4–7.7)
Neutrophils Relative %: 49.1 % (ref 43.0–77.0)
Platelets: 263 10*3/uL (ref 150.0–400.0)
RBC: 4.16 Mil/uL (ref 3.87–5.11)
RDW: 12.8 % (ref 11.5–15.5)
WBC: 5.5 10*3/uL (ref 4.0–10.5)

## 2022-09-21 LAB — BASIC METABOLIC PANEL
BUN: 12 mg/dL (ref 6–23)
CO2: 27 mEq/L (ref 19–32)
Calcium: 8.8 mg/dL (ref 8.4–10.5)
Chloride: 105 mEq/L (ref 96–112)
Creatinine, Ser: 0.77 mg/dL (ref 0.40–1.20)
GFR: 93.07 mL/min (ref >60.00)
Glucose, Bld: 78 mg/dL (ref 70–99)
Potassium: 4.2 mEq/L (ref 3.5–5.1)
Sodium: 138 mEq/L (ref 135–145)

## 2022-09-21 LAB — TSH: TSH: 1.62 u[IU]/mL (ref 0.35–5.50)

## 2022-09-21 LAB — VITAMIN D 25 HYDROXY (VIT D DEFICIENCY, FRACTURES): VITD: 58.55 ng/mL (ref 30.00–100.00)

## 2022-09-21 NOTE — Assessment & Plan Note (Signed)
Weight is stable.  Encouraged her to take time for herself and get back to regular exercise.  Will check labs to risk stratify.

## 2022-09-21 NOTE — Assessment & Plan Note (Signed)
Pt's PE WNL w/ exception of BMI.  UTD on pap, mammo, Tdap.  Colonoscopy tomorrow.  Will get flu after.  Check labs.  Anticipatory guidance provided.

## 2022-09-21 NOTE — Progress Notes (Signed)
   Subjective:    Patient ID: Lori Zavala, female    DOB: [birth year], 46 y.o.   MRN: [id number]  HPI CPE- UTD on Tdap, pap, mammo.  Colonoscopy tomorrow  Health Maintenance  Topic Date Due   INFLUENZA VACCINE  02/21/2023 (Originally 06/23/2022)   COLONOSCOPY (Pts 45-40yr Insurance coverage will need to be confirmed)  06/16/2023 (Originally 03/01/2022)   TETANUS/TDAP  07/15/2025   PAP SMEAR-Modifier  08/05/2025   COVID-19 Vaccine  Completed   HPV VACCINES  Aged Out   Hepatitis C Screening  Discontinued   HIV Screening  Discontinued      Review of Systems Patient reports no vision/ hearing changes, adenopathy,fever, weight change,  persistant/recurrent hoarseness , swallowing issues, chest pain, palpitations, edema, persistant/recurrent cough, hemoptysis, dyspnea (rest/exertional/paroxysmal nocturnal), gastrointestinal bleeding (melena, rectal bleeding), abdominal pain, significant heartburn, bowel changes, GU symptoms (dysuria, hematuria, incontinence), Gyn symptoms (abnormal  bleeding, pain),  syncope, focal weakness, memory loss, numbness & tingling, skin/hair/nail changes, abnormal bruising or bleeding, anxiety, or depression.     Objective:   Physical Exam General Appearance:    Alert, cooperative, no distress, appears stated age, obese  Head:    Normocephalic, without obvious abnormality, atraumatic  Eyes:    PERRL, conjunctiva/corneas clear, EOM's intact both eyes  Ears:    Normal TM's and external ear canals, both ears  Nose:   Nares normal, septum midline, mucosa normal, no drainage    or sinus tenderness  Throat:   Lips, mucosa, and tongue normal; teeth and gums normal  Neck:   Supple, symmetrical, trachea midline, no adenopathy;    Thyroid: no enlargement/tenderness/nodules  Back:     Symmetric, no curvature, ROM normal, no CVA tenderness  Lungs:     Clear to auscultation bilaterally, respirations unlabored  Chest Wall:    No tenderness or deformity   Heart:    Regular  rate and rhythm, S1 and S2 normal, no murmur, rub   or gallop  Breast Exam:    Deferred to GYN  Abdomen:     Soft, non-tender, bowel sounds active all four quadrants,    no masses, no organomegaly  Genitalia:    Deferred to GYN  Rectal:    Extremities:   Extremities normal, atraumatic, no cyanosis or edema  Pulses:   2+ and symmetric all extremities  Skin:   Skin color, texture, turgor normal, no rashes or lesions  Lymph nodes:   Cervical, supraclavicular, and axillary nodes normal  Neurologic:   CNII-XII intact, normal strength, sensation and reflexes    throughout          Assessment & Plan:

## 2022-09-21 NOTE — Patient Instructions (Signed)
Follow up in 1 year or as needed We'll notify you of your lab results and make any changes if needed Make sure you are taking care of YOU!!! Call with any questions or concerns Stay Safe!  Stay Healthy! GOOD LUCK TOMORROW!!!

## 2022-09-22 ENCOUNTER — Ambulatory Visit (HOSPITAL_COMMUNITY)
Admission: RE | Admit: 2022-09-22 | Discharge: 2022-09-22 | Disposition: A | Discharge status: Home / Self Care | Payer: 59 | Attending: Internal Medicine | Admitting: Internal Medicine

## 2022-09-22 ENCOUNTER — Encounter (HOSPITAL_COMMUNITY)
Admission: RE | Disposition: A | Discharge status: Home / Self Care | Payer: Self-pay | Source: Home / Self Care | Attending: Internal Medicine

## 2022-09-22 ENCOUNTER — Ambulatory Visit (HOSPITAL_COMMUNITY): Payer: 59 | Admitting: Physician Assistant

## 2022-09-22 ENCOUNTER — Ambulatory Visit (HOSPITAL_BASED_OUTPATIENT_CLINIC_OR_DEPARTMENT_OTHER): Payer: 59 | Admitting: Physician Assistant

## 2022-09-22 ENCOUNTER — Encounter (HOSPITAL_COMMUNITY): Payer: Self-pay | Admitting: Internal Medicine

## 2022-09-22 ENCOUNTER — Other Ambulatory Visit: Payer: Self-pay

## 2022-09-22 DIAGNOSIS — K635 Polyp of colon: Secondary | ICD-10-CM | POA: Insufficient documentation

## 2022-09-22 DIAGNOSIS — D123 Benign neoplasm of transverse colon: Secondary | ICD-10-CM | POA: Diagnosis not present

## 2022-09-22 DIAGNOSIS — F419 Anxiety disorder, unspecified: Secondary | ICD-10-CM

## 2022-09-22 DIAGNOSIS — Z1211 Encounter for screening for malignant neoplasm of colon: Secondary | ICD-10-CM

## 2022-09-22 DIAGNOSIS — Z1212 Encounter for screening for malignant neoplasm of rectum: Secondary | ICD-10-CM | POA: Diagnosis not present

## 2022-09-22 HISTORY — PX: POLYPECTOMY: SHX5525

## 2022-09-22 HISTORY — PX: COLONOSCOPY WITH PROPOFOL: SHX5780

## 2022-09-22 HISTORY — DX: Other complications of anesthesia, initial encounter: T88.59XA

## 2022-09-22 SURGERY — COLONOSCOPY WITH PROPOFOL
Anesthesia: Monitor Anesthesia Care

## 2022-09-22 MED ORDER — AMISULPRIDE (ANTIEMETIC) 5 MG/2ML IV SOLN: 10.0000 mg | Freq: Once | INTRAVENOUS | Status: DC | PRN

## 2022-09-22 MED ORDER — ONDANSETRON HCL 4 MG/2ML IJ SOLN: 4.0000 mg | Freq: Once | INTRAMUSCULAR | Status: DC | PRN

## 2022-09-22 MED ORDER — ACETAMINOPHEN 500 MG PO TABS
ORAL_TABLET | ORAL | Status: AC
  Filled 2022-09-22: qty 1

## 2022-09-22 MED ORDER — FENTANYL CITRATE (PF) 100 MCG/2ML IJ SOLN
INTRAMUSCULAR | Status: AC
  Filled 2022-09-22: qty 2

## 2022-09-22 MED ORDER — ACETAMINOPHEN 160 MG/5ML PO SOLN
325.0000 mg | Freq: Once | ORAL | Status: AC
  Filled 2022-09-22: qty 20.3

## 2022-09-22 MED ORDER — LIDOCAINE HCL 1 % IJ SOLN
INTRAMUSCULAR | Status: DC | PRN
  Administered 2022-09-22: 20 mg via INTRADERMAL
  Administered 2022-09-22: 80 mg via INTRADERMAL

## 2022-09-22 MED ORDER — SODIUM CHLORIDE 0.9 % IV SOLN: INTRAVENOUS | Status: DC

## 2022-09-22 MED ORDER — PROPOFOL 500 MG/50ML IV EMUL
INTRAVENOUS | Status: DC | PRN
  Administered 2022-09-22: 90 ug/kg/min via INTRAVENOUS

## 2022-09-22 MED ORDER — PROPOFOL 10 MG/ML IV BOLUS
INTRAVENOUS | Status: DC | PRN
  Administered 2022-09-22: 10 mg via INTRAVENOUS
  Administered 2022-09-22: 20 mg via INTRAVENOUS
  Administered 2022-09-22: 10 mg via INTRAVENOUS

## 2022-09-22 MED ORDER — LACTATED RINGERS IV SOLN: INTRAVENOUS | Status: DC | PRN

## 2022-09-22 MED ORDER — FENTANYL CITRATE (PF) 100 MCG/2ML IJ SOLN
25.0000 ug | INTRAMUSCULAR | Status: DC | PRN
  Administered 2022-09-22 (×2): 25 ug via INTRAVENOUS

## 2022-09-22 MED ORDER — ACETAMINOPHEN 325 MG PO TABS
325.0000 mg | ORAL_TABLET | Freq: Once | ORAL | Status: AC
  Administered 2022-09-22: 500 mg via ORAL
  Filled 2022-09-22: qty 2

## 2022-09-22 SURGICAL SUPPLY — 22 items

## 2022-09-22 NOTE — Op Note (Signed)
[facility] Patient Name: Lori Zavala Procedure Date: 09/22/2022 MRN: [id number] Attending MD: Jerene Bears , MD, [id number] Date of Birth: 24-Jul-1976 CSN: [id number] Age: 46 Admit Type: Outpatient Procedure:                Colonoscopy Indications:              Screening for colorectal malignant neoplasm, This                            is the patient's first colonoscopy Providers:                Lajuan Lines. Hilarie Fredrickson, MD, Dulcy Fanny, Darliss Cheney,                            Technician, Karis Juba, CRNA Referring MD:             Aundra Millet. Birdie Riddle, MD Medicines:                Monitored Anesthesia Care Complications:            No immediate complications. Estimated Blood Loss:     Estimated blood loss: none. Procedure:                Pre-Anesthesia Assessment:                           - Prior to the procedure, a History and Physical                            was performed, and patient medications and                            allergies were reviewed. The patient's tolerance of                            previous anesthesia was also reviewed. The risks                            and benefits of the procedure and the sedation                            options and risks were discussed with the patient.                            All questions were answered, and informed consent                            was obtained. Prior Anticoagulants: The patient has                            taken no anticoagulant or antiplatelet agents. ASA                            Grade Assessment: II - A patient with mild systemic  disease. After reviewing the risks and benefits,                            the patient was deemed in satisfactory condition to                            undergo the procedure.                           After obtaining informed consent, the colonoscope                            was passed under direct vision. Throughout the                             procedure, the patient's blood pressure, pulse, and                            oxygen saturations were monitored continuously. The                            CF-HQ190L ([id number]) Olympus colonoscope was                            introduced through the anus and advanced to the                            cecum, identified by the appendiceal orifice,                            ileocecal valve and palpation. The colonoscopy was                            performed without difficulty. The patient tolerated                            the procedure well. The quality of the bowel                            preparation was excellent. The ileocecal valve,                            appendiceal orifice, and rectum were photographed. Scope In: 10:15:53 AM Scope Out: 10:30:06 AM Scope Withdrawal Time: 0 hours 11 minutes 27 seconds  Total Procedure Duration: 0 hours 14 minutes 13 seconds  Findings:      The digital rectal exam was normal.      A 3 mm polyp was found in the hepatic flexure. The polyp was sessile.       The polyp was removed with a cold snare. Resection and retrieval were       complete.      The exam was otherwise without abnormality on direct and retroflexion       views. Impression:               - One 3 mm polyp  at the hepatic flexure, removed                            with a cold snare. Resected and retrieved.                           - The examination was otherwise normal on direct                            and retroflexion views. Moderate Sedation:      N/A Recommendation:           - Patient has a contact number available for                            emergencies. The signs and symptoms of potential                            delayed complications were discussed with the                            patient. Return to normal activities tomorrow.                            Written discharge instructions were provided to the                             patient.                           - Resume previous diet.                           - Continue present medications.                           - Await pathology results.                           - Repeat colonoscopy is recommended. The                            colonoscopy date will be determined after pathology                            results from today's exam become available for                            review. Procedure Code(s):        --- Professional ---                           [id number], Colonoscopy, flexible; with removal of                            tumor(s), polyp(s), or other lesion(s) by snare  technique Diagnosis Code(s):        --- Professional ---                           Z12.11, Encounter for screening for malignant                            neoplasm of colon                           D12.3, Benign neoplasm of transverse colon (hepatic                            flexure or splenic flexure) CPT copyright 2022 American Medical Association. All rights reserved. The codes documented in this report are preliminary and upon coder review may  be revised to meet current compliance requirements. Jerene Bears, MD 09/22/2022 10:53:15 AM This report has been signed electronically. Number of Addenda: 0

## 2022-09-22 NOTE — Progress Notes (Signed)
Informed pt of lab results  

## 2022-09-22 NOTE — Transfer of Care (Signed)
Immediate Anesthesia Transfer of Care Note  Patient: Lori Zavala  Procedure(s) Performed: COLONOSCOPY WITH PROPOFOL POLYPECTOMY  Patient Location: PACU and Endoscopy Unit  Anesthesia Type:MAC  Level of Consciousness: awake, alert , oriented and patient cooperative  Airway & Oxygen Therapy: Patient Spontanous Breathing and Patient connected to face mask oxygen  Post-op Assessment: Report given to RN and Post -op Vital signs reviewed and stable  Post vital signs: Reviewed and stable  Last Vitals:  Vitals Value Taken Time  BP 136/82 09/22/22 1040  Temp 36.4 C 09/22/22 1039  Pulse 80 09/22/22 1041  Resp 26 09/22/22 1041  SpO2 100 % 09/22/22 1041  Vitals shown include unvalidated device data.  Last Pain:  Vitals:   09/22/22 1039  TempSrc: Tympanic  PainSc: Asleep         Complications: No notable events documented.

## 2022-09-22 NOTE — Anesthesia Postprocedure Evaluation (Signed)
Anesthesia Post Note  Patient: Lori Zavala  Procedure(s) Performed: COLONOSCOPY WITH PROPOFOL POLYPECTOMY     Patient location during evaluation: PACU Anesthesia Type: MAC Level of consciousness: awake and alert Vital Signs Assessment: post-procedure vital signs reviewed and stable Respiratory status: spontaneous breathing, nonlabored ventilation, respiratory function stable and patient connected to nasal cannula oxygen Cardiovascular status: stable and blood pressure returned to baseline Postop Assessment: no apparent nausea or vomiting Anesthetic complications: no Comments: Pt has complaint of neck pain, possible neck spasm. She typically manages at home and endorsed the discomfort prior to the procedure today. She has appointment with her physical therapist later today.    No notable events documented.  Last Vitals:  Vitals:   09/22/22 1150 09/22/22 1200  BP: 138/83 130/68  Pulse: 78 78  Resp: 12 13  Temp:    SpO2: 97% 100%    Last Pain:  Vitals:   09/22/22 1150  TempSrc:   PainSc: [place]

## 2022-09-22 NOTE — Discharge Instructions (Signed)
YOU HAD AN ENDOSCOPIC PROCEDURE TODAY: Refer to the procedure report and other information in the discharge instructions given to you for any specific questions about what was found during the examination. If this information does not answer your questions, please call [facility] office at [phone] to clarify.  ° °YOU SHOULD EXPECT: Some feelings of bloating in the abdomen. Passage of more gas than usual. Walking can help get rid of the air that was put into your GI tract during the procedure and reduce the bloating. If you had a lower endoscopy (such as a colonoscopy or flexible sigmoidoscopy) you may notice spotting of blood in your stool or on the toilet paper. Some abdominal soreness may be present for a day or two, also. ° °DIET: Your first meal following the procedure should be a light meal and then it is ok to progress to your normal diet. A half-sandwich or bowl of soup is an example of a good first meal. Heavy or fried foods are harder to digest and may make you feel nauseous or bloated. Drink plenty of fluids but you should avoid alcoholic beverages for 24 hours. If you had a esophageal dilation, please see attached instructions for diet.   ° °ACTIVITY: Your care partner should take you home directly after the procedure. You should plan to take it easy, moving slowly for the rest of the day. You can resume normal activity the day after the procedure however YOU SHOULD NOT DRIVE, use power tools, machinery or perform tasks that involve climbing or major physical exertion for 24 hours (because of the sedation medicines used during the test).  ° °SYMPTOMS TO REPORT IMMEDIATELY: °A gastroenterologist can be reached at any hour. Please call [phone]  for any of the following symptoms:  °Following lower endoscopy (colonoscopy, flexible sigmoidoscopy) °Excessive amounts of blood in the stool  °Significant tenderness, worsening of abdominal pains  °Swelling of the abdomen that is new, acute  °Fever of 100° or  higher  °Following upper endoscopy (EGD, EUS, ERCP, esophageal dilation) °Vomiting of blood or coffee ground material  °New, significant abdominal pain  °New, significant chest pain or pain under the shoulder blades  °Painful or persistently difficult swallowing  °New shortness of breath  °Black, tarry-looking or red, bloody stools ° °FOLLOW UP:  °If any biopsies were taken you will be contacted by phone or by letter within the next 1-3 weeks. Call [phone]  if you have not heard about the biopsies in 3 weeks.  °Please also call with any specific questions about appointments or follow up tests. ° °

## 2022-09-22 NOTE — Anesthesia Preprocedure Evaluation (Signed)
Anesthesia Evaluation  Patient identified by MRN, date of birth, ID band Patient awake    Reviewed: Allergy & Precautions, NPO status , Patient's Chart, lab work & pertinent test results  Airway Mallampati: II  TM Distance: >3 FB Neck ROM: Full    Dental  (+) Teeth Intact, Dental Advisory Given   Pulmonary neg pulmonary ROS,    breath sounds clear to auscultation       Cardiovascular negative cardio ROS   Rhythm:Regular Rate:Normal     Neuro/Psych  Headaches, PSYCHIATRIC DISORDERS Anxiety    GI/Hepatic negative GI ROS, Neg liver ROS,   Endo/Other  negative endocrine ROS  Renal/GU negative Renal ROS     Musculoskeletal negative musculoskeletal ROS (+)   Abdominal   Peds  Hematology   Anesthesia Other Findings - L sided neck pain & assoc. HA  Reproductive/Obstetrics                             Anesthesia Physical Anesthesia Plan  ASA: 3  Anesthesia Plan: MAC   Post-op Pain Management:    Induction: Intravenous  PONV Risk Score and Plan: 0 and Propofol infusion  Airway Management Planned:   Additional Equipment: None  Intra-op Plan:   Post-operative Plan:   Informed Consent: I have reviewed the patients History and Physical, chart, labs and discussed the procedure including the risks, benefits and alternatives for the proposed anesthesia with the patient or authorized representative who has indicated his/her understanding and acceptance.       Plan Discussed with: CRNA  Anesthesia Plan Comments: (Filters available in event we have to use ventilator. )        Anesthesia Quick Evaluation

## 2022-09-22 NOTE — H&P (Signed)
GASTROENTEROLOGY PROCEDURE H&P NOTE   Primary Care Physician: Midge Minium, MD    Reason for Procedure:  Colon cancer screening  Plan:    Colonoscopy   The nature of the procedure, as well as the risks, benefits, and alternatives were carefully and thoroughly reviewed with the patient. Ample time for discussion and questions allowed. The patient understood, was satisfied, and agreed to proceed.     HPI: Lori Zavala is a 46 y.o. female who presents for colonoscopy.  Medical history as below.  Tolerated the prep.  No recent chest pain or shortness of breath.  No abdominal pain today.  Some muscle spasm in her neck.  Rifaximin earlier this year led to resolution of diarrhea.  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 Authorizing Provider  Amphetamine ER (DYANAVEL XR) 5 MG CHER Take 1 tablet by mouth daily in the afternoon. Patient taking differently: Take 2.5 mg by mouth in the morning. 09/15/22  Yes Midge Minium, MD  B Complex Vitamins (B COMPLEX PO) Take 1 capsule by mouth at bedtime.   Yes [provider]  Cholecalciferol (VITAMIN D) 125 MCG (5000 UT) CAPS Take 5,000 Units by mouth daily at 12 noon.   Yes [provider]  EPINEPHrine (EPIPEN 2-PAK) 0.3 mg/0.3  mL IJ SOAJ injection Inject 0.3 mg into the muscle as needed for anaphylaxis. 12/16/21  Yes Midge Minium, MD  Ferrous Sulfate (BL IRON PO) Take 26 mg by mouth daily at 12 noon.   Yes [provider]  fexofenadine (ALLEGRA) 180 MG tablet Take 180 mg by mouth at bedtime. 02/12/22  Yes [provider]  MAGNESIUM PO Take 120 mg by mouth in the morning and at bedtime.   Yes [provider]  mexiletine (MEXITIL) 150 MG capsule Take 150 mg by mouth 2 (two) times daily. (In the morning & with lunch)   Yes [provider]  mexiletine (MEXITIL) 200 MG capsule Take 200 mg by mouth at bedtime. 07/19/20  Yes [provider]  Misc Natural Products (MAGIC MUSHROOM MIX) CAPS Take 1 capsule by mouth in the morning, at noon, and at bedtime. Cordyceps/Reishi/Lion Mane mushroom complex (500 mg-500 mg-500 mg)   Yes [provider]  Na Sulfate-K Sulfate-Mg Sulf 17.5-3.13-1.6 GM/177ML SOLN Take 1 kit by mouth as directed. 05/22/22  Yes [provider]  Taurine (MEGA TAURINE) 1000 MG CAPS Take 1,000 mg by mouth daily at 12 noon.   Yes [provider]  TURMERIC PO Take 700 mg by mouth in the morning.   Yes [provider]  Licorice Deglycyrrhizinated POWD Take 300 mg by mouth  at bedtime. DGL (Delycyrrhizinated Licorice Root Extract) Capsule    [provider]    No current facility-administered medications for this encounter.    Allergies as of 09/15/2022 - Review Complete 06/15/2022  Allergen Reaction Noted   Succinylcholine  03/13/2019    Family History  Problem Relation Age of Onset   Alcohol abuse Father    Cancer Father        prostate   Hyperlipidemia Father    Stroke Father    Hypertension Father    Kidney disease Father    Mental illness Father    Diabetes Father    Bipolar disorder Sister    Mental retardation Brother    Breast cancer Maternal Aunt 23   Breast cancer Paternal Aunt 26       dx'd 3 diff  times   Cancer Maternal Grandmother        breast   Cancer Paternal Grandmother        breast   Colon cancer Neg Hx    Esophageal cancer Neg Hx    Stomach cancer Neg Hx    Rectal cancer Neg Hx     Social History   Socioeconomic History   Marital status: Married    Spouse name: Not on file   Number of children: 2   Years of education: Not on file   Highest education level: Bachelor's degree (e.g., BA, AB, BS)  Occupational History   Occupation: realtor  Tobacco Use   Smoking status: Never   Smokeless tobacco: Never  Vaping Use   Vaping Use: Never used  Substance and Sexual Activity   Alcohol use: Yes    Comment: once monthly   Drug use: Not Currently    Types: Marijuana    Comment: quit college   Sexual activity: Yes  Other Topics Concern   Not on file  Social History Narrative   Lives home with husband and 2 kids.  [employer] designer, theatre/television/film (realtor).  Education+  [employer].        Patient is right-handed. She lives with her husband and 2 children. She drinks 1-2 large glasses of 1/2-1/2 tea a day. She works out vigorously 3 x week for over an hour.    Social Determinants of Health   Financial Resource Strain: Not on file  Food Insecurity: Not on file  Transportation Needs: Not on file  Physical Activity: Not on file  Stress: Not on file  Social Connections: Not on file  Intimate Partner Violence: Not on file    Physical Exam: Vital signs in last 24 hours: _0  (!) 143/91   Pulse 87   Temp 97.6 F (36.4 C) (Axillary)   Resp 18   Ht _1  (1.6 m)   Wt 86.2 kg   SpO2 100%   BMI 33.66 kg/m  GEN: NAD EYE: Sclerae anicteric ENT: MMM CV: Non-tachycardic Pulm: CTA b/l GI: Soft, NT/ND NEURO:  Alert & Oriented x 3   Zenovia Jarred, MD [facility]  09/22/2022 9:53 AM

## 2022-09-22 NOTE — Progress Notes (Signed)
Patient woke up from her colonoscopy with a locked muscle in her neck from her Myotonia congenita. Treated patient in recovery with two doses of 23mg of fentanyl and '500mg'$  of tylenol. No relief. Anesthesiologist recommended patient going to the emergency room for pain management. Patient was able to get an outpatient appointment with her PT to get "needling therapy" at 1pm. She was confident this would release the muscle. Patient discharged home with husband to get to her 1pm appointment. Discharge paperwork reviewed and vitals stable at discharge. Anesthesiologist aware of patient discharged and talked to patient at the bedside as well.

## 2022-09-23 ENCOUNTER — Encounter: Payer: Self-pay | Admitting: Family Medicine

## 2022-09-23 ENCOUNTER — Encounter: Payer: Self-pay | Admitting: Internal Medicine

## 2022-09-23 ENCOUNTER — Encounter (HOSPITAL_COMMUNITY): Payer: Self-pay | Admitting: Internal Medicine

## 2022-09-23 LAB — SURGICAL PATHOLOGY

## 2022-09-25 ENCOUNTER — Ambulatory Visit: Payer: BC Managed Care – PPO | Admitting: Psychology

## 2022-10-12 ENCOUNTER — Encounter: Payer: Self-pay | Admitting: Family Medicine

## 2022-10-13 MED ORDER — DYANAVEL XR 10 MG PO CHER: 1.0000 | CHEWABLE_EXTENDED_RELEASE_TABLET | Freq: Every day | ORAL | 0 refills | Status: DC

## 2022-10-30 ENCOUNTER — Ambulatory Visit (INDEPENDENT_AMBULATORY_CARE_PROVIDER_SITE_OTHER): Payer: 59 | Admitting: Psychology

## 2022-10-30 ENCOUNTER — Encounter: Payer: Self-pay | Admitting: Internal Medicine

## 2022-10-30 DIAGNOSIS — F411 Generalized anxiety disorder: Secondary | ICD-10-CM | POA: Diagnosis not present

## 2022-10-30 MED ORDER — RIFAXIMIN 550 MG PO TABS: 550.0000 mg | ORAL_TABLET | Freq: Three times a day (TID) | ORAL | 0 refills | Status: DC

## 2022-10-30 NOTE — Telephone Encounter (Signed)
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

## 2022-10-30 NOTE — Progress Notes (Signed)
Treatment Plan Date: 10/30/2022  Diagnosis F41.1 (Generalized anxiety disorder) [n/a]  Symptoms Anxiety related to perceived or actual job jeopardy. (Status: maintained) -- No Description Entered  Excessive and/or unrealistic worry that is difficult to control occurring more days than not for at least 6 months about a number of events or activities. (Status: maintained) -- No Description Entered  Medication Status compliance  Safety none  If Suicidal or Homicidal State Action Taken: unspecified  Current Risk: low Medications unspecified Objectives Related Problem: Increase sense of self-esteem and elevation of mood in spite of unemployment. Description: Learn and implement calming skills to reduce overall anxiety and manage anxiety symptoms. Target Date: 2023-04-30 Frequency: Daily Modality: individual Progress: 80%  Related Problem: Increase sense of self-esteem and elevation of mood in spite of unemployment. Description: Identify and replace distorted cognitive messages associated with feelings of job stress. Target Date: 2023-04-30 Frequency: Daily Modality: individual Progress: 75%  Related Problem: Increase sense of self-esteem and elevation of mood in spite of unemployment. Description: Describe the nature and history of the vocational stress. Target Date: 2023-04-30 Frequency: Daily Modality: individual Progress: 80%  Related Problem: Resolve the core conflict that is the source of anxiety. Description: Identify, challenge, and replace biased, fearful self-talk with positive, realistic, and empowering self-talk. Target Date: 2023-04-30 Frequency: Daily Modality: individual Progress: 75%  Related Problem: Resolve the core conflict that is the source of anxiety. Description: Learn and implement calming skills to reduce overall anxiety and manage anxiety symptoms. Target Date: 2023-04-30 Frequency: Daily Modality: individual Progress: 80%   Related Problem: Resolve the core conflict that is the source of anxiety. Description: Describe situations, thoughts, feelings, and actions associated with anxieties and worries, their impact on functioning, and attempts to resolve them. Target Date: 2023-04-30 Frequency: Daily Modality: individual Progress: 75%  Client Response full compliance  Service Location Location, [address] [place], [place] [address]  Service Code cpt [phone]  Related past to present  Facilitate problem solving  Identify automatic thoughts  Identify/label emotions  Validate/empathize  Self care activities  Lifestyle change (exercise, nutrition)  Self-monitoring  Session notes:  F 41.1  Goals: Patient is seeking to reduce her anxiety associated significant conflict in her family of origin. In addition, she has had considerable distress related to an undiagnosed physical injury that has compromised her participation in physical activity. Finally, she is struggling to define her professional path. she has had multiple careers, but is currently not satisfies and is seeking to find fulfilling employment. Needs help in identifying what is important for her to consider. Goal date is 6-24   Patient agreed to have a Webex video appointment. She is at her home and I am at my home office.   Sammye has started her new job at the [employer]. She feels she caught on very quickly and is far more confident than before. Her focus has not been a problem. She is taking Dyanavel, which is an amphetamine, and it is helping her stay on track. She says it is like Aderrall except it is a slow 14 hour release. Prescription is for '5mg'$ , but she only takes 2.'5mg'$ .  She says that the Thanksgiving holiday was drama free. The prior weekend they went to her father in [place]. Doshia's mother bought a new house and they had Thanksgiving at her house. Yisel was in charge of taking care of selling her sister's house. She feels  she  is not managing herself well and says "I am surprised I haven't gotten sick". She is not scheduling down time for herself. She is clear that she is not setting limits. Is trying to let her husband manage some of the kids activities, but feels guilty about missing some of their activities. She is having to cut back on some of her activities due to her effort to be more fiscally responsible. She loves her job and is doing well. She has the flexibility to work as many or as few hours as she wants. She is averaging about 28 hours/week. Will increase in December and January to help pay for Christmas.  She says that Pieter Partridge is in individual therapy and that things are better in the marriage. She feels she is better about "letting things go". Needs to work on [place], PhD time:10:35a-11:30p 55 min.

## 2022-11-30 ENCOUNTER — Ambulatory Visit (INDEPENDENT_AMBULATORY_CARE_PROVIDER_SITE_OTHER): Payer: 59 | Admitting: Psychology

## 2022-11-30 DIAGNOSIS — F411 Generalized anxiety disorder: Secondary | ICD-10-CM | POA: Diagnosis not present

## 2022-11-30 NOTE — Progress Notes (Addendum)
Treatment Plan Date: 11/30/2022  Diagnosis F41.1 (Generalized anxiety disorder) [n/a]  Symptoms Anxiety related to perceived or actual job jeopardy. (Status: maintained) -- No Description Entered  Excessive and/or unrealistic worry that is difficult to control occurring more days than not for at least 6 months about a number of events or activities. (Status: maintained) -- No Description Entered  Medication Status compliance  Safety none  If Suicidal or Homicidal State Action Taken: unspecified  Current Risk: low Medications unspecified Objectives Related Problem: Increase sense of self-esteem and elevation of mood in spite of unemployment. Description: Learn and implement calming skills to reduce overall anxiety and manage anxiety symptoms. Target Date: 2023-04-30 Frequency: Daily Modality: individual Progress: 80%  Related Problem: Increase sense of self-esteem and elevation of mood in spite of unemployment. Description: Identify and replace distorted cognitive messages associated with feelings of job stress. Target Date: 2023-04-30 Frequency: Daily Modality: individual Progress: 75%  Related Problem: Increase sense of self-esteem and elevation of mood in spite of unemployment. Description: Describe the nature and history of the vocational stress. Target Date: 2023-04-30 Frequency: Daily Modality: individual Progress: 80%  Related Problem: Resolve the core conflict that is the source of anxiety. Description: Identify, challenge, and replace biased, fearful self-talk with positive, realistic, and empowering self-talk. Target Date: 2023-04-30 Frequency: Daily Modality: individual Progress: 75%  Related Problem: Resolve the core conflict that is the source of anxiety. Description: Learn and implement calming skills to reduce overall anxiety and manage anxiety symptoms. Target Date: 2023-04-30 Frequency:  Daily Modality: individual Progress: 80%  Related Problem: Resolve the core conflict that is the source of anxiety. Description: Describe situations, thoughts, feelings, and actions associated with anxieties and worries, their impact on functioning, and attempts to resolve them. Target Date: 2023-04-30 Frequency: Daily Modality: individual Progress: 75%  Client Response full compliance  Service Location Location, [address] [place], [place] [address]  Service Code cpt [phone]  Related past to present  Facilitate problem solving  Identify automatic thoughts  Identify/label emotions  Validate/empathize  Self care activities  Lifestyle change (exercise, nutrition)  Self-monitoring  Session notes:  F 41.1  Goals: Patient is seeking to reduce her anxiety associated significant conflict in her family of origin. In addition, she has had considerable distress related to an undiagnosed physical injury that has compromised her participation in physical activity. Finally, she is struggling to define her professional path. she has had multiple careers, but is currently not satisfies and is seeking to find fulfilling employment. Needs help in identifying what is important for her to consider. Goal date is 6-24   Patient agreed to have a Webex video appointment. She is at her home and I am at my home office.   Lori Zavala wants to talk about multi-tasking and getting used to a "set scheduling". She needs to be better about planning her schedule to accommodate her physical limitations. We talked about using technology to assist her in balancing her schedule. She needs to get more organized to help reduce her stress. Her stress has an impact on her physical health. Suggested she meet with employer. Discussed how to spin the conversation in a positive direction. Will also explore a health care provider that can help her with some hormone challenges/concerns she is having.  Lori Morel, PhD time:10:45a-11:30a 45 min.

## 2022-12-11 ENCOUNTER — Encounter: Payer: Self-pay | Admitting: Family Medicine

## 2022-12-29 ENCOUNTER — Other Ambulatory Visit: Payer: Self-pay | Admitting: Family Medicine

## 2022-12-29 MED ORDER — DYANAVEL XR 10 MG PO CHER: 1.0000 | CHEWABLE_EXTENDED_RELEASE_TABLET | Freq: Every day | ORAL | 0 refills | Status: DC

## 2022-12-29 NOTE — Telephone Encounter (Signed)
Adderall Xr  10 mg LOV: 09/21/22 Last Refill:10/13/22 Upcoming appt: 09/24/23

## 2022-12-29 NOTE — Telephone Encounter (Signed)
Pt aware rx has been sent in  

## 2022-12-31 ENCOUNTER — Ambulatory Visit (INDEPENDENT_AMBULATORY_CARE_PROVIDER_SITE_OTHER): Payer: 59 | Admitting: Psychology

## 2022-12-31 DIAGNOSIS — F411 Generalized anxiety disorder: Secondary | ICD-10-CM | POA: Diagnosis not present

## 2022-12-31 NOTE — Progress Notes (Signed)
Treatment Plan Date: 12/31/2022  Diagnosis F41.1 (Generalized anxiety disorder) [n/a]  Symptoms Anxiety related to perceived or actual job jeopardy. (Status: maintained) -- No Description Entered  Excessive and/or unrealistic worry that is difficult to control occurring more days than not for at least 6 months about a number of events or activities. (Status: maintained) -- No Description Entered  Medication Status compliance  Safety none  If Suicidal or Homicidal State Action Taken: unspecified  Current Risk: low Medications unspecified Objectives Related Problem: Increase sense of self-esteem and elevation of mood in spite of unemployment. Description: Learn and implement calming skills to reduce overall anxiety and manage anxiety symptoms. Target Date: 2023-04-30 Frequency: Daily Modality: individual Progress: 80%  Related Problem: Increase sense of self-esteem and elevation of mood in spite of unemployment. Description: Identify and replace distorted cognitive messages associated with feelings of job stress. Target Date: 2023-04-30 Frequency: Daily Modality: individual Progress: 75%  Related Problem: Increase sense of self-esteem and elevation of mood in spite of unemployment. Description: Describe the nature and history of the vocational stress. Target Date: 2023-04-30 Frequency: Daily Modality: individual Progress: 80%  Related Problem: Resolve the core conflict that is the source of anxiety. Description: Identify, challenge, and replace biased, fearful self-talk with positive, realistic, and empowering self-talk. Target Date: 2023-04-30 Frequency: Daily Modality: individual Progress: 75%  Related Problem: Resolve the core conflict that is the source of anxiety. Description: Learn and implement calming skills to reduce overall anxiety and manage anxiety symptoms. Target Date: 2023-04-30 Frequency: Daily Modality: individual Progress: 80%  Related Problem: Resolve  the core conflict that is the source of anxiety. Description: Describe situations, thoughts, feelings, and actions associated with anxieties and worries, their impact on functioning, and attempts to resolve them. Target Date: 2023-04-30 Frequency: Daily Modality: individual Progress: 75%  Client Response full compliance  Service Location Location, [address] [place], [place] [address]  Service Code cpt [phone]  Related past to present  Facilitate problem solving  Identify automatic thoughts  Identify/label emotions  Validate/empathize  Self care activities  Lifestyle change (exercise, nutrition)  Self-monitoring  Session notes:  F 41.1  Goals: Patient is seeking to reduce her anxiety associated significant conflict in her family of origin. In addition, she has had considerable distress related to an undiagnosed physical injury that has compromised her participation in physical activity. Finally, she is struggling to define her professional path. she has had multiple careers, but is currently not satisfies and is seeking to find fulfilling employment. Needs help in identifying what is important for her to consider. Goal date is 6-24   Patient agreed to have a Webex video appointment. She is at her home and I am at my home office.   Nariyah says she got Covid 19 for the first time. She had multiple symptoms and it put her behind at work. She related a story when her daughter was carless and spilled a drink. Leilyn says she over-reacted and screamed beyond what was reasonable. She feels it is because she has asked her daughter multiple times and she did not feel heard. She feels unheard in the family and this triggered her upset about it.                                          [name], PhD time:1:10p-2:00p 50 min.

## 2023-01-29 ENCOUNTER — Other Ambulatory Visit: Payer: Self-pay | Admitting: Family Medicine

## 2023-01-29 MED ORDER — DYANAVEL XR 10 MG PO CHER: 1.0000 | CHEWABLE_EXTENDED_RELEASE_TABLET | Freq: Every day | ORAL | 0 refills | Status: DC

## 2023-03-10 ENCOUNTER — Encounter: Payer: Self-pay | Admitting: Family Medicine

## 2023-03-10 MED ORDER — DYANAVEL XR 10 MG PO CHER: 1.0000 | CHEWABLE_EXTENDED_RELEASE_TABLET | Freq: Every day | ORAL | 0 refills | Status: DC

## 2023-04-12 DIAGNOSIS — G7111 Myotonic muscular dystrophy: Secondary | ICD-10-CM | POA: Insufficient documentation

## 2023-05-04 ENCOUNTER — Other Ambulatory Visit: Payer: Self-pay | Admitting: Family Medicine

## 2023-05-04 NOTE — Telephone Encounter (Signed)
Dyanavel XR 10 mg LOV: 09/21/22 Last Refill:03/10/23 Upcoming appt: 09/24/23

## 2023-05-05 MED ORDER — DYANAVEL XR 10 MG PO CHER: 1.0000 | CHEWABLE_EXTENDED_RELEASE_TABLET | Freq: Every day | ORAL | 0 refills | Status: DC

## 2023-05-05 NOTE — Telephone Encounter (Signed)
Pt aware of refill.

## 2023-06-21 ENCOUNTER — Other Ambulatory Visit: Payer: Self-pay | Admitting: Family Medicine

## 2023-06-22 MED ORDER — DYANAVEL XR 10 MG PO CHER: 1.0000 | CHEWABLE_EXTENDED_RELEASE_TABLET | Freq: Every day | ORAL | 0 refills | Status: DC

## 2023-06-22 NOTE — Telephone Encounter (Signed)
Dyanavel XR 10 mg LOV: 09/22/23 Last Refill:05/05/23 Upcoming appt: 09/24/23

## 2023-08-18 ENCOUNTER — Other Ambulatory Visit: Payer: Self-pay | Admitting: Family Medicine

## 2023-08-18 MED ORDER — DYANAVEL XR 10 MG PO CHER: 1.0000 | CHEWABLE_EXTENDED_RELEASE_TABLET | Freq: Every day | ORAL | 0 refills | Status: DC

## 2023-08-18 NOTE — Telephone Encounter (Signed)
Patient is requesting a refill of the following medications: Requested Prescriptions   Pending Prescriptions Disp Refills   Amphetamine ER (DYANAVEL XR) 10 MG CHER 30 tablet 0    Sig: Take 1 tablet (10 mg total) by mouth daily in the afternoon.    Date of patient request: 08/18/23 Last office visit: 09/21/22 Date of last refill: 06/22/23 Last refill amount: 30 Follow up time period per chart: 1 year

## 2023-09-10 DIAGNOSIS — G7112 Myotonia congenita: Secondary | ICD-10-CM | POA: Insufficient documentation

## 2023-09-24 ENCOUNTER — Encounter: Payer: Self-pay | Admitting: Family Medicine

## 2023-09-24 ENCOUNTER — Ambulatory Visit (INDEPENDENT_AMBULATORY_CARE_PROVIDER_SITE_OTHER): Payer: 59 | Admitting: Family Medicine

## 2023-09-24 VITALS — BP 102/68 | HR 78 | Temp 97.8°F | Ht 63.0 in | Wt 198.2 lb

## 2023-09-24 DIAGNOSIS — Z1159 Encounter for screening for other viral diseases: Secondary | ICD-10-CM

## 2023-09-24 DIAGNOSIS — Z114 Encounter for screening for human immunodeficiency virus [HIV]: Secondary | ICD-10-CM | POA: Diagnosis not present

## 2023-09-24 DIAGNOSIS — E669 Obesity, unspecified: Secondary | ICD-10-CM

## 2023-09-24 DIAGNOSIS — Z0000 Encounter for general adult medical examination without abnormal findings: Secondary | ICD-10-CM

## 2023-09-24 LAB — CBC WITH DIFFERENTIAL/PLATELET
Basophils Absolute: 0 10*3/uL (ref 0.0–0.1)
Basophils Relative: 0.5 % (ref 0.0–3.0)
Eosinophils Absolute: 0.1 10*3/uL (ref 0.0–0.7)
Eosinophils Relative: 2.2 % (ref 0.0–5.0)
HCT: 38.6 % (ref 36.0–46.0)
Hemoglobin: 12.6 g/dL (ref 12.0–15.0)
Lymphocytes Relative: 33.1 % (ref 12.0–46.0)
Lymphs Abs: 1.9 10*3/uL (ref 0.7–4.0)
MCHC: 32.6 g/dL (ref 30.0–36.0)
MCV: 90 fL (ref 78.0–100.0)
Monocytes Absolute: 0.4 10*3/uL (ref 0.1–1.0)
Monocytes Relative: 7 % (ref 3.0–12.0)
Neutro Abs: 3.3 10*3/uL (ref 1.4–7.7)
Neutrophils Relative %: 57.2 % (ref 43.0–77.0)
Platelets: 275 10*3/uL (ref 150.0–400.0)
RBC: 4.29 Mil/uL (ref 3.87–5.11)
RDW: 12.3 % (ref 11.5–15.5)
WBC: 5.8 10*3/uL (ref 4.0–10.5)

## 2023-09-24 LAB — TSH: TSH: 1.99 u[IU]/mL (ref 0.35–5.50)

## 2023-09-24 LAB — BASIC METABOLIC PANEL
BUN: 13 mg/dL (ref 6–23)
CO2: 26 meq/L (ref 19–32)
Calcium: 9.1 mg/dL (ref 8.4–10.5)
Chloride: 105 meq/L (ref 96–112)
Creatinine, Ser: 0.99 mg/dL (ref 0.40–1.20)
GFR: 68.35 mL/min (ref >60.00)
Glucose, Bld: 84 mg/dL (ref 70–99)
Potassium: 3.8 meq/L (ref 3.5–5.1)
Sodium: 139 meq/L (ref 135–145)

## 2023-09-24 LAB — HEPATIC FUNCTION PANEL
ALT: 18 U/L (ref 0–35)
AST: 18 U/L (ref 0–37)
Albumin: 3.9 g/dL (ref 3.5–5.2)
Alkaline Phosphatase: 34 U/L — ABNORMAL LOW (ref 39–117)
Bilirubin, Direct: 0.1 mg/dL (ref 0.0–0.3)
Total Bilirubin: 0.3 mg/dL (ref 0.2–1.2)
Total Protein: 6.8 g/dL (ref 6.0–8.3)

## 2023-09-24 LAB — LIPID PANEL
Cholesterol: 194 mg/dL (ref 0–200)
HDL: 61.5 mg/dL (ref >39.00)
LDL Cholesterol: 98 mg/dL (ref 0–99)
NonHDL: 132.37
Total CHOL/HDL Ratio: 3
Triglycerides: 171 mg/dL — ABNORMAL HIGH (ref 0.0–149.0)
VLDL: 34.2 mg/dL (ref 0.0–40.0)

## 2023-09-24 LAB — VITAMIN D 25 HYDROXY (VIT D DEFICIENCY, FRACTURES): VITD: 55.04 ng/mL (ref 30.00–100.00)

## 2023-09-24 NOTE — Patient Instructions (Signed)
Follow up in 1 year or as needed We'll notify you of your lab results and make any changes if needed Keep up the good work on healthy diet and regular exercise- you're doing great!! Call with any questions or concerns Stay Safe!  Stay Healthy! Happy Fall!!! 

## 2023-09-24 NOTE — Assessment & Plan Note (Signed)
Pt's PE WNL w/ exception of BMI.  UTD on Tdap, pap, colonoscopy.  Will hold on flu due to weekend plans.  Check labs.  Anticipatory guidance provided.

## 2023-09-24 NOTE — Progress Notes (Signed)
   Subjective:    Patient ID: Lori Zavala, female    DOB: November 24, 1975, 47 y.o.   MRN: [id number]  HPI CPE- UTD on Tdap, pap, colonoscopy  Patient Care Team    Relationship Specialty Notifications Start End  Sheliah Hatch, MD PCP - General Family Medicine  12/03/14     Health Maintenance  Topic Date Due   HIV Screening  Never done   Hepatitis C Screening  Never done   INFLUENZA VACCINE  06/24/2023   COVID-19 Vaccine (4 - 2023-24 season) 07/25/2023   DTaP/Tdap/Td (3 - Td or Tdap) 07/15/2025   Cervical Cancer Screening (HPV/Pap Cotest)  08/27/2025   Colonoscopy  09/22/2032   HPV VACCINES  Aged Out      Review of Systems Patient reports no vision/ hearing changes, adenopathy,fever, weight change,  persistant/recurrent hoarseness , swallowing issues, chest pain, palpitations, edema, persistant/recurrent cough, hemoptysis, dyspnea (rest/exertional/paroxysmal nocturnal), gastrointestinal bleeding (melena, rectal bleeding), abdominal pain, significant heartburn, bowel changes, GU symptoms (dysuria, hematuria, incontinence), Gyn symptoms (abnormal  bleeding, pain),  syncope, focal weakness, memory loss, numbness & tingling, skin/hair/nail changes, abnormal bruising or bleeding, anxiety, or depression.     Objective:   Physical Exam General Appearance:    Alert, cooperative, no distress, appears stated age  Head:    Normocephalic, without obvious abnormality, atraumatic  Eyes:    PERRL, conjunctiva/corneas clear, EOM's intact both eyes  Ears:    Normal TM's and external ear canals, both ears  Nose:   Nares normal, septum midline, mucosa normal, no drainage    or sinus tenderness  Throat:   Lips, mucosa, and tongue normal; teeth and gums normal  Neck:   Supple, symmetrical, trachea midline, no adenopathy;    Thyroid: no enlargement/tenderness/nodules  Back:     Symmetric, no curvature, ROM normal, no CVA tenderness  Lungs:     Clear to auscultation bilaterally, respirations unlabored   Chest Wall:    No tenderness or deformity   Heart:    Regular rate and rhythm, S1 and S2 normal, no murmur, rub   or gallop  Breast Exam:    Deferred to GYN  Abdomen:     Soft, non-tender, bowel sounds active all four quadrants,    no masses, no organomegaly  Genitalia:    Deferred to GYN  Rectal:    Extremities:   Extremities normal, atraumatic, no cyanosis or edema  Pulses:   2+ and symmetric all extremities  Skin:   Skin color, texture, turgor normal, no rashes or lesions  Lymph nodes:   Cervical, supraclavicular, and axillary nodes normal  Neurologic:   CNII-XII intact, normal strength, sensation and reflexes    throughout          Assessment & Plan:

## 2023-09-25 LAB — HEPATITIS C ANTIBODY: Hepatitis C Ab: NONREACTIVE

## 2023-09-25 LAB — HIV ANTIBODY (ROUTINE TESTING W REFLEX): HIV 1&2 Ab, 4th Generation: NONREACTIVE

## 2023-09-27 ENCOUNTER — Telehealth: Payer: Self-pay

## 2023-09-27 NOTE — Telephone Encounter (Signed)
-----   Message from Neena Rhymes sent at 09/27/2023  7:51 AM EST ----- Labs look great!  No changes at this time

## 2023-10-15 ENCOUNTER — Encounter: Payer: Self-pay | Admitting: Family Medicine

## 2023-11-08 ENCOUNTER — Encounter (INDEPENDENT_AMBULATORY_CARE_PROVIDER_SITE_OTHER): Payer: 59 | Admitting: Family Medicine

## 2023-11-08 DIAGNOSIS — B359 Dermatophytosis, unspecified: Secondary | ICD-10-CM | POA: Diagnosis not present

## 2023-11-08 MED ORDER — CLOTRIMAZOLE-BETAMETHASONE 1-0.05 % EX CREA: 1.0000 | TOPICAL_CREAM | Freq: Two times a day (BID) | CUTANEOUS | 0 refills | Status: DC

## 2023-11-08 NOTE — Addendum Note (Signed)
Addended by: Sheliah Hatch on: 11/08/2023 02:15 PM   Modules accepted: Orders

## 2023-11-08 NOTE — Telephone Encounter (Signed)
[facility] VISIT   Patient agreed to [facility] visit and is aware that copayment and coinsurance may apply. Patient was treated using telemedicine according to accepted telemedicine protocols.  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   Allergic rhinitis 07/07/2016   Chronic pansinusitis 07/07/2016   Deviated septum 07/07/2016   Dysfunction of both eustachian tubes 07/07/2016   Hypertrophy, nasal, turbinate 07/07/2016   ADHD (attention deficit hyperactivity disorder), combined type 02/17/2016   Physical exam 07/16/2015   Obesity (BMI 30-39.9) 05/02/2010   BACK PAIN, THORACIC REGION 08/05/2009   SEBACEOUS CYST 04/09/2009   Anemia 02/06/2008   CHICKENPOX, HX OF 02/06/2008   Social History   Tobacco Use   Smoking status: Never   Smokeless tobacco: Never  Substance Use Topics   Alcohol use: Yes    Comment: once monthly    Current Outpatient Medications:    clotrimazole-betamethasone (LOTRISONE) cream, Apply 1 Application topically 2 (two) times daily., Disp: 45 g, Rfl: 0   Amphetamine ER (DYANAVEL XR) 10 MG CHER, Take 1 tablet (10 mg total) by mouth daily in the afternoon., Disp: 30 tablet, Rfl: 0   B Complex Vitamins (B COMPLEX PO), Take 1 capsule by mouth at bedtime., Disp: , Rfl:    Cholecalciferol (VITAMIN D) 125 MCG (5000 UT) CAPS, Take 5,000 Units by mouth daily at 12 noon., Disp: , Rfl:    EPINEPHrine (EPIPEN 2-PAK) 0.3 mg/0.3 mL IJ SOAJ injection, Inject 0.3 mg into the muscle as needed for anaphylaxis., Disp: 1 each, Rfl: 1   Ferrous Sulfate (BL IRON PO), Take 26 mg by mouth daily at 12 noon., Disp: , Rfl:    fexofenadine (ALLEGRA) 180 MG tablet, Take 180 mg by mouth at bedtime.,  Disp: , Rfl:    fluticasone (FLONASE) 50 MCG/ACT nasal spray, 1 spray in each nostril Nasally Once a day for 30 days, Disp: , Rfl:    Lactobacillus Rhamnosus, GG, (CULTURELLE) CAPS, Take 1 capsule by mouth daily., Disp: , Rfl:    Licorice Deglycyrrhizinated POWD, Take 300 mg by mouth at bedtime. DGL (Delycyrrhizinated Licorice Root Extract) Capsule, Disp: , Rfl:    Magnesium Citrate 125 MG CAPS, Take by mouth., Disp: , Rfl:    MAGNESIUM PO, Take 120 mg by mouth in the morning and at bedtime., Disp: , Rfl:    mexiletine (MEXITIL) 150 MG capsule, Take 150 mg by mouth 2 (two) times daily. (In the morning & with lunch), Disp: , Rfl:    mexiletine (MEXITIL) 200 MG capsule, Take 200 mg by mouth at bedtime., Disp: , Rfl:    Misc Natural Products (MAGIC MUSHROOM MIX) CAPS, Take 1 capsule by mouth in the morning, at noon, and at bedtime. Cordyceps/Reishi/Lion Mane mushroom complex (500 mg-500 mg-500 mg), Disp: , Rfl:    Multiple Vitamin (MULTI-VITAMIN) tablet, Take 1 tablet by mouth daily., Disp: , Rfl:    NIKKI 3-0.02 MG tablet, Take 1 tablet by mouth daily., Disp: , Rfl:    Taurine (MEGA TAURINE) 1000 MG CAPS, Take 1,000 mg by mouth daily at 12 noon., Disp: , Rfl:    TURMERIC PO, Take 700 mg by mouth in the morning., Disp: , Rfl:   Current Facility-Administered Medications:    EPINEPHrine (  EPI-PEN) injection 0.3 mg, 0.3 mg, Subcutaneous, Once, Sheliah Hatch, MD  Allergies  Allergen Reactions   Succinylcholine     At high risk of malignant hyperthermia due to myotonia congenita   Peanut (Diagnostic) Diarrhea, Rash and Other (See Comments)    Abdominal cramps    Assessment and Plan:   Diagnosis: ringworm. Please see myChart communication and orders below.   No orders of the defined types were placed in this encounter.  Meds ordered this encounter  Medications   clotrimazole-betamethasone (LOTRISONE) cream    Sig: Apply 1 Application topically 2 (two) times daily.    Dispense:  45 g     Refill:  0    Neena Rhymes, MD 11/08/2023  A total of 6 minutes were spent by me to personally review the patient-generated inquiry, review patient records and data pertinent to assessment of the patient's problem, develop a management plan including generation of prescriptions and/or orders, and on subsequent communication with the patient through secure the MyChart portal service.   There is no separately reported E/M service related to this service in the past 7 days nor does the patient have an upcoming soonest available appointment for this issue. This work was completed in less than 7 days.   The patient consented to this service today (see patient agreement prior to ongoing communication). Patient counseled regarding the need for in-person exam for certain conditions and was advised to call the office if any changing or worsening symptoms occur.   The codes to be used for the E/M service are: [x]   99421 for 5-10 minutes of time spent on the inquiry. []   [id number] for 11-20 minutes. []   [id number] for 21+ minutes.

## 2023-11-18 ENCOUNTER — Telehealth: Payer: Self-pay | Admitting: Family Medicine

## 2023-11-18 MED ORDER — PROMETHAZINE-DM 6.25-15 MG/5ML PO SYRP: 5.0000 mL | ORAL_SOLUTION | Freq: Four times a day (QID) | ORAL | 0 refills | Status: DC | PRN

## 2023-11-18 NOTE — Telephone Encounter (Signed)
Pt went to [facility] and was dx'd w/ 'walking PNA' earlier this month.  Is feeling better w/ exception of persistent cough.  She is out of promethazine cough syrup.  Asking for refill.  Refill sent to pharmacy.

## 2023-11-25 ENCOUNTER — Encounter: Payer: Self-pay | Admitting: Family Medicine

## 2023-11-29 ENCOUNTER — Emergency Department (HOSPITAL_BASED_OUTPATIENT_CLINIC_OR_DEPARTMENT_OTHER): Payer: 59

## 2023-11-29 ENCOUNTER — Encounter (HOSPITAL_BASED_OUTPATIENT_CLINIC_OR_DEPARTMENT_OTHER): Payer: Self-pay | Admitting: Emergency Medicine

## 2023-11-29 DIAGNOSIS — J111 Influenza due to unidentified influenza virus with other respiratory manifestations: Secondary | ICD-10-CM | POA: Diagnosis not present

## 2023-11-29 DIAGNOSIS — R0781 Pleurodynia: Secondary | ICD-10-CM | POA: Insufficient documentation

## 2023-11-29 DIAGNOSIS — R0789 Other chest pain: Secondary | ICD-10-CM | POA: Diagnosis present

## 2023-11-29 DIAGNOSIS — R000 Tachycardia, unspecified: Secondary | ICD-10-CM | POA: Insufficient documentation

## 2023-11-29 LAB — CBC WITH DIFFERENTIAL/PLATELET
Abs Immature Granulocytes: 0.01 10*3/uL (ref 0.00–0.07)
Basophils Absolute: 0 10*3/uL (ref 0.0–0.1)
Basophils Relative: 0 %
Eosinophils Absolute: 0 10*3/uL (ref 0.0–0.5)
Eosinophils Relative: 0 %
HCT: 31.9 % — ABNORMAL LOW (ref 36.0–46.0)
Hemoglobin: 10.7 g/dL — ABNORMAL LOW (ref 12.0–15.0)
Immature Granulocytes: 0 %
Lymphocytes Relative: 14 %
Lymphs Abs: 0.7 10*3/uL (ref 0.7–4.0)
MCH: 29.5 pg (ref 26.0–34.0)
MCHC: 33.5 g/dL (ref 30.0–36.0)
MCV: 87.9 fL (ref 80.0–100.0)
Monocytes Absolute: 0.4 10*3/uL (ref 0.1–1.0)
Monocytes Relative: 8 %
Neutro Abs: 3.7 10*3/uL (ref 1.7–7.7)
Neutrophils Relative %: 78 %
Platelets: 212 10*3/uL (ref 150–400)
RBC: 3.63 MIL/uL — ABNORMAL LOW (ref 3.87–5.11)
RDW: 11.9 % (ref 11.5–15.5)
WBC: 4.9 10*3/uL (ref 4.0–10.5)
nRBC: 0 % (ref 0.0–0.2)

## 2023-11-29 LAB — BASIC METABOLIC PANEL
Anion gap: 6 (ref 5–15)
BUN: 15 mg/dL (ref 6–20)
CO2: 25 mmol/L (ref 22–32)
Calcium: 8.1 mg/dL — ABNORMAL LOW (ref 8.9–10.3)
Chloride: 107 mmol/L (ref 98–111)
Creatinine, Ser: 0.88 mg/dL (ref 0.44–1.00)
GFR, Estimated: >60 mL/min (ref >60)
Glucose, Bld: 118 mg/dL — ABNORMAL HIGH (ref 70–99)
Potassium: 3.9 mmol/L (ref 3.5–5.1)
Sodium: 138 mmol/L (ref 135–145)

## 2023-11-29 LAB — PREGNANCY, URINE: Preg Test, Ur: NEGATIVE

## 2023-11-29 MED ORDER — IOHEXOL 350 MG/ML SOLN
75.0000 mL | Freq: Once | INTRAVENOUS | Status: AC | PRN
  Administered 2023-11-29: 75 mL via INTRAVENOUS

## 2023-11-29 NOTE — ED Triage Notes (Signed)
 Seen at [facility]  Tachycardic and sob. +flu, and elevated Ddimer  Sent for eval

## 2023-11-30 ENCOUNTER — Emergency Department (HOSPITAL_BASED_OUTPATIENT_CLINIC_OR_DEPARTMENT_OTHER)
Admission: EM | Admit: 2023-11-30 | Discharge: 2023-11-30 | Disposition: A | Discharge status: Home / Self Care | Payer: 59 | Attending: Emergency Medicine | Admitting: Emergency Medicine

## 2023-11-30 DIAGNOSIS — R0781 Pleurodynia: Secondary | ICD-10-CM

## 2023-11-30 DIAGNOSIS — J111 Influenza due to unidentified influenza virus with other respiratory manifestations: Secondary | ICD-10-CM

## 2023-11-30 MED ORDER — OSELTAMIVIR PHOSPHATE 75 MG PO CAPS: 75.0000 mg | ORAL_CAPSULE | Freq: Two times a day (BID) | ORAL | 0 refills | Status: AC

## 2023-11-30 NOTE — Discharge Instructions (Signed)
 You were evaluated in the Emergency Department and after careful evaluation, we did not find any emergent condition requiring admission or further testing in the hospital.  Your exam/testing today was overall reassuring.  CT can did not show any signs of blood clots or other emergencies.  Symptoms likely due to the flu with possibly some soreness or inflammation causing the pain.  Continue Tylenol  or Motrin at home for discomfort, cough suppressants, plenty of fluids.  Use the Tamiflu  to help you get better faster.  Please return to the Emergency Department if you experience any worsening of your condition.  Thank you for allowing us  to be a part of your care.

## 2023-11-30 NOTE — ED Provider Notes (Signed)
 [facility] Emergency Department Provider Note MRN:  [id number]  Arrival date & time: 11/30/23     Chief Complaint   Abnormal Lab and Shortness of Breath   History of Present Illness   Lori Zavala is a 48 y.o. year-old female with no pertinent past medical history presenting to the ED with chief complaint of abnormal lab.  Patient explains that she was recovering from a bronchitis during December, was finally feeling better, went out shopping the other day, came back sick.  Has tested positive for the flu earlier today.  She was having some persistent elevated heart rates as well as pain in the chest with deep breathing and so D-dimer was done in the outpatient setting which was positive, sent here for evaluation for blood clots.  Patient currently without significant discomfort, heart rate feels better.  The pain was described as a sharpness sometimes in the chest, sometimes in the back, worse with deep breaths, sometimes worse with motion.  No chest heaviness or pressure, no dizziness or diaphoresis, no nausea vomiting, denies trouble breathing.  Review of Systems  A thorough review of systems was obtained and all systems are negative except as noted in the HPI and PMH.   Patient's Health History    Past Medical History:  Diagnosis Date   ADHD (attention deficit hyperactivity disorder)    Allergy    Anemia    Complication of anesthesia    High risk for malignant hyperthemia d/t myotonia congenita   Frequent headaches    GAD (generalized anxiety disorder)    Myotonia congenita, autosomal dominant     Past Surgical History:  Procedure Laterality Date   BREAST SURGERY Bilateral 07/2014   reduction   CESAREAN SECTION     has had 2   COLONOSCOPY WITH PROPOFOL  N/A 09/22/2022   Procedure: COLONOSCOPY WITH PROPOFOL ;  Surgeon: Albertus Gordy HERO, MD;  Location: [facility];  Service: Gastroenterology;  Laterality: N/A;   COSMETIC SURGERY     breast reduction,  Sept 2015   dental implant     ganlgion cyst Bilateral    MANDIBLE SURGERY     NASAL SEPTUM SURGERY  2016   POLYPECTOMY  09/22/2022   Procedure: POLYPECTOMY;  Surgeon: Albertus Gordy HERO, MD;  Location: [facility];  Service: Gastroenterology;;   prk  2005   REDUCTION MAMMAPLASTY Bilateral    2015    Family History  Problem Relation Age of Onset   Alcohol abuse Father    Cancer Father        prostate   Hyperlipidemia Father    Stroke Father    Hypertension Father    Kidney disease Father    Mental illness Father    Diabetes Father    Bipolar disorder Sister    Mental retardation Brother    Breast cancer Maternal Aunt 50   Breast cancer Paternal Aunt 50       dx'd 3 diff times   Cancer Maternal Grandmother        breast   Cancer Paternal Grandmother        breast   Colon cancer Neg Hx    Esophageal cancer Neg Hx    Stomach cancer Neg Hx    Rectal cancer Neg Hx     Social History   Socioeconomic History   Marital status: Married    Spouse name: Not on file   Number of children: 2   Years of education: Not on file   Highest education  level: Bachelor's degree (e.g., BA, AB, BS)  Occupational History   Occupation: realtor  Tobacco Use   Smoking status: Never   Smokeless tobacco: Never  Vaping Use   Vaping status: Never Used  Substance and Sexual Activity   Alcohol use: Yes    Comment: once monthly   Drug use: Not Currently    Types: Marijuana    Comment: quit college   Sexual activity: Yes  Other Topics Concern   Not on file  Social History Narrative   Lives home with husband and 2 kids.  Norman Situ and Aeronautical Engineer (realtor).  Education+  [employer].        Patient is right-handed. She lives with her husband and 2 children. She drinks 1-2 large glasses of 1/2-1/2 tea a day. She works out vigorously 3 x week for over an hour.    Social Drivers of Corporate Investment Banker Strain: Not on file  Food Insecurity: Not on file  Transportation Needs: Not on  file  Physical Activity: Not on file  Stress: Not on file  Social Connections: Not on file  Intimate Partner Violence: Not on file     Physical Exam   Vitals:   11/29/23 2049  BP: 128/75  Pulse: (!) 125  Resp: 20  Temp: 98.4 F (36.9 C)  SpO2: 100%    CONSTITUTIONAL: Well-appearing, NAD NEURO/PSYCH:  Alert and oriented x 3, no focal deficits EYES:  eyes equal and reactive ENT/NECK:  no LAD, no JVD CARDIO: Tachycardic rate, well-perfused, normal S1 and S2 PULM:  CTAB no wheezing or rhonchi GI/GU:  non-distended, non-tender MSK/SPINE:  No gross deformities, no edema SKIN:  no rash, atraumatic   *Additional and/or pertinent findings included in MDM below  Diagnostic and Interventional Summary    EKG Interpretation Date/Time:  Monday November 29 2023 20:52:25 EST Ventricular Rate:  123 PR Interval:  144 QRS Duration:  70 QT Interval:  308 QTC Calculation: 440 R Axis:   74  Text Interpretation: Sinus tachycardia Otherwise normal ECG No previous ECGs available Confirmed by Theadore Sharper [phone]) on 11/29/2023 11:08:12 PM       Labs Reviewed  CBC WITH DIFFERENTIAL/PLATELET - Abnormal; Notable for the following components:      Result Value   RBC 3.63 (*)    Hemoglobin 10.7 (*)    HCT 31.9 (*)    All other components within normal limits  BASIC METABOLIC PANEL - Abnormal; Notable for the following components:   Glucose, Bld 118 (*)    Calcium 8.1 (*)    All other components within normal limits  PREGNANCY, URINE    CT Angio Chest PE W and/or Wo Contrast  Final Result      Medications  iohexol  (OMNIPAQUE ) 350 MG/ML injection 75 mL (75 mLs Intravenous Contrast Given 11/29/23 2250)     Procedures  /  Critical Care Procedures  ED Course and Medical Decision Making  Initial Impression and Ddx Patient is known flu positive here with tachycardia and pleuritic chest pain and positive D-dimer awaiting CTA.  PE is considered, as is discomfort related to flu, pleurisy,  pneumonia.  Patient has little to no risk factors for ACS, is not describing atypical chest pain, EKG is without concerning ischemic features, highly doubt primary cardiac cause of patient's discomfort.  Past medical/surgical history that increases complexity of ED encounter: None  Interpretation of Diagnostics I personally reviewed the EKG and my interpretation is as follows: Sinus tachycardia without ischemic features  Labs  reassuring without significant blood count or electrolyte disturbance.  CTA is largely normal, no PE.  Patient Reassessment and Ultimate Disposition/Management     Patient was tachycardic to 125 in triage, on my assessment heart rate is 102, she is feeling good and is very motivated to go home.  Advised plenty of fluids at home, appropriate for discharge.  Patient management required discussion with the following services or consulting groups:  None  Complexity of Problems Addressed Acute illness or injury that poses threat of life of bodily function  Additional Data Reviewed and Analyzed Further history obtained from: Further history from spouse/family member  Additional Factors Impacting ED Encounter Risk Prescriptions  Ozell HERO. Theadore, MD [facility] Emergency Medicine [facility] [email or web address]  Final Clinical Impressions(s) / ED Diagnoses     ICD-10-CM   1. Pleuritic chest pain  R07.81     2. Influenza  J11.1       ED Discharge Orders          Ordered    oseltamivir  (TAMIFLU ) 75 MG capsule  Every 12 hours        11/30/23 0217             Discharge Instructions Discussed with and Provided to Patient:    Discharge Instructions      You were evaluated in the Emergency Department and after careful evaluation, we did not find any emergent condition requiring admission or further testing in the hospital.  Your exam/testing today was overall reassuring.  CT can did not show any signs of blood clots or other  emergencies.  Symptoms likely due to the flu with possibly some soreness or inflammation causing the pain.  Continue Tylenol  or Motrin at home for discomfort, cough suppressants, plenty of fluids.  Use the Tamiflu  to help you get better faster.  Please return to the Emergency Department if you experience any worsening of your condition.  Thank you for allowing us  to be a part of your care.       Theadore Ozell HERO, MD 11/30/23 [phone]

## 2023-12-02 ENCOUNTER — Ambulatory Visit: Payer: 59 | Admitting: Family Medicine

## 2023-12-09 ENCOUNTER — Other Ambulatory Visit (HOSPITAL_BASED_OUTPATIENT_CLINIC_OR_DEPARTMENT_OTHER): Payer: Self-pay

## 2023-12-09 ENCOUNTER — Ambulatory Visit: Payer: 59 | Admitting: Family Medicine

## 2023-12-09 ENCOUNTER — Telehealth: Payer: Self-pay

## 2023-12-09 ENCOUNTER — Encounter: Payer: Self-pay | Admitting: Family Medicine

## 2023-12-09 VITALS — BP 108/68 | HR 122 | Temp 97.8°F | Ht 63.0 in | Wt 195.2 lb

## 2023-12-09 DIAGNOSIS — Z6834 Body mass index (BMI) 34.0-34.9, adult: Secondary | ICD-10-CM | POA: Diagnosis not present

## 2023-12-09 DIAGNOSIS — E669 Obesity, unspecified: Secondary | ICD-10-CM

## 2023-12-09 MED ORDER — ZEPBOUND 2.5 MG/0.5ML ~~LOC~~ SOAJ
2.5000 mg | SUBCUTANEOUS | 1 refills | Status: DC
  Filled 2023-12-09 – 2023-12-10 (×2): qty 2, 28d supply, fill #0

## 2023-12-09 MED ORDER — ZEPBOUND 2.5 MG/0.5ML ~~LOC~~ SOAJ: 2.5000 mg | SUBCUTANEOUS | 1 refills | Status: DC

## 2023-12-09 NOTE — Assessment & Plan Note (Signed)
Deteriorated.  Her Neuromuscular specialist at [facility] told her that she's in a tough situation b/c she needs to lose weight to improve pain and muscle function, but her myotonia limits her ability to exercise.  She is eating a healthy diet and has tried medication and structured programs in the past.  Will start Zepbound in hopes of losing weight and then maintaining.  Pt expressed understanding and is in agreement w/ plan.

## 2023-12-09 NOTE — Patient Instructions (Signed)
Once we determine if the medication is covered, we can decide on follow up Continue to work on healthy diet and regular exercise- you're doing great! Call with any questions or concerns Stay Safe!  Stay Healthy! Happy New Year!!

## 2023-12-09 NOTE — Progress Notes (Signed)
   Subjective:    Patient ID: Lori Zavala, female    DOB: Dec 28, 1975, 48 y.o.   MRN: [id number]  HPI Obesity- ongoing issue for pt.  BMI 34.59  Pt is active but is limited in her ability to exercise due to her myotonia congenita.  Was told by Neuromuscular specialist at [facility] that her muscles would do better if she could lose weight.  Eats very healthy- husband is an elite runner and they eat the same meals.  Has taken phentermine in the past but due to tachycardia this is not an option.  Did Optivia w/ some success but meals got expensive.  Both parents are obese and she knows that she is fighting genetics.     Review of Systems For ROS see HPI     Objective:   Physical Exam Vitals reviewed.  Constitutional:      Appearance: Normal appearance. She is obese. She is not ill-appearing.  HENT:     Head: Normocephalic and atraumatic.  Cardiovascular:     Rate and Rhythm: Regular rhythm. Tachycardia present.     Pulses: Normal pulses.     Heart sounds: Normal heart sounds.  Pulmonary:     Effort: Pulmonary effort is normal. No respiratory distress.     Breath sounds: No wheezing or rhonchi.  Skin:    General: Skin is warm and dry.  Neurological:     General: No focal deficit present.     Mental Status: She is alert and oriented to person, place, and time.  Psychiatric:        Mood and Affect: Mood normal.        Behavior: Behavior normal.        Thought Content: Thought content normal.           Assessment & Plan:

## 2023-12-09 NOTE — Telephone Encounter (Signed)
Pharmacy Patient Advocate Encounter   Received notification from  [facility] Portal that prior authorization for Zepbound 2.5MG /0.5ML pen-injectors is required/requested.   Insurance verification completed.   The patient is insured through [facility] .   Per test claim: PA required; PA started via CoverMyMeds. KEY [id number] . Waiting for clinical questions to populate.

## 2023-12-10 ENCOUNTER — Other Ambulatory Visit (HOSPITAL_BASED_OUTPATIENT_CLINIC_OR_DEPARTMENT_OTHER): Payer: Self-pay

## 2023-12-10 ENCOUNTER — Other Ambulatory Visit (HOSPITAL_COMMUNITY): Payer: Self-pay

## 2023-12-10 NOTE — Telephone Encounter (Signed)
Pharmacy Patient Advocate Encounter   Received notification from CoverMyMeds that prior authorization for Zepbound 2.5MG /0.5ML pen-injectors is required/requested.   Insurance verification completed.   The patient is insured through [facility] .   Per test claim: PA required; PA submitted to above mentioned insurance via CoverMyMeds Key/confirmation #/EOC [id number] Status is pending

## 2023-12-10 NOTE — Telephone Encounter (Signed)
Pharmacy Patient Advocate Encounter  Received notification from [facility] that Prior Authorization for Zepbound 2.5MG /0.5ML pen-injectors  has been APPROVED from 12/10/23 to 08/09/24. Unable to obtain price due to refill too soon rejection, last fill date 12/10/23 next available fill date2/8/25   PA #/Case ID/Reference #: [id number]

## 2023-12-15 ENCOUNTER — Encounter: Payer: Self-pay | Admitting: Family Medicine

## 2023-12-16 MED ORDER — DYANAVEL XR 10 MG PO TBCR: 1.0000 | EXTENDED_RELEASE_TABLET | Freq: Every day | ORAL | 0 refills | Status: DC

## 2023-12-16 NOTE — Addendum Note (Signed)
Addended by: Sheliah Hatch on: 12/16/2023 03:47 PM   Modules accepted: Orders

## 2024-01-05 ENCOUNTER — Other Ambulatory Visit: Payer: Self-pay | Admitting: Family Medicine

## 2024-01-05 ENCOUNTER — Encounter: Payer: Self-pay | Admitting: Physician Assistant

## 2024-01-05 ENCOUNTER — Encounter: Payer: Self-pay | Admitting: Family Medicine

## 2024-01-05 ENCOUNTER — Ambulatory Visit: Payer: 59 | Admitting: Physician Assistant

## 2024-01-05 VITALS — BP 100/68 | HR 100 | Ht 63.0 in | Wt 189.5 lb

## 2024-01-05 DIAGNOSIS — Z860102 Personal history of hyperplastic colon polyps: Secondary | ICD-10-CM

## 2024-01-05 DIAGNOSIS — K580 Irritable bowel syndrome with diarrhea: Secondary | ICD-10-CM | POA: Diagnosis not present

## 2024-01-05 MED ORDER — TIRZEPATIDE-WEIGHT MANAGEMENT 5 MG/0.5ML ~~LOC~~ SOLN: 5.0000 mg | SUBCUTANEOUS | 1 refills | Status: DC

## 2024-01-05 NOTE — Progress Notes (Signed)
Addendum: Reviewed and agree with assessment and management plan. Asha Grumbine, Carie Caddy, MD

## 2024-01-05 NOTE — Addendum Note (Signed)
Addended by: Sheliah Hatch on: 01/05/2024 04:25 PM   Modules accepted: Orders

## 2024-01-05 NOTE — Patient Instructions (Addendum)
_______________________________________________________  If your blood pressure at your visit was 140/90 or greater, please contact your primary care physician to follow up on this.  If you are age 47 or younger, your body mass index should be between 19-25. Your Body mass index is 33.57 kg/m. If this is out of the aformentioned range listed, please consider follow up with your Primary Care Provider.  ________________________________________________________  The [place] [facility] providers would like to encourage you to use [facility] to communicate with providers for non-urgent requests or questions.  Due to long hold times on the telephone, sending your provider a message by [facility] may be a faster and more efficient way to get a response.  Please allow 48 business hours for a response.  Please remember that this is for non-urgent requests.  _______________________________________________________  Bonita Quin will follow up in our office on an as needed basis.  Thank you for entrusting me with your care and choosing [facility].  Hyacinth Meeker, PA-C

## 2024-01-05 NOTE — Progress Notes (Signed)
Chief Complaint: IBS flare  HPI:    Lori Zavala is a 48 year old female with a past medical history as listed below including generalized anxiety disorder, known to Dr. Rhea Belton, who was referred to me by Sheliah Hatch, MD for a complaint of IBS.    09/22/2022 colonoscopy with one 3 mm polyp at the hepatic flexure and otherwise normal.  Pathology showed a hyperplastic polyp.  Repeat recommended 10 years.    11/29/2023 CBC with a hemoglobin down to 10.7 (12.63 months ago).  BMP with a glucose elevated 119 otherwise normal.    Today, the patient explains that she was diagnosed with IBS-D back even before her teenage years.  Always remembers that her parents thought it was linked to stress but she has found later in her life that this is not really the case.  Describes always having to carry Imodium with her because she never knew when the cramping and diarrhea would hit.  Tells me that most recently she just started a GLP-1 and a low-carb diet and for the past 3 weeks has only had 2 separate flares only when she ate gluten.  She thinks she may have a gluten sensitivity.  Apparently tested for celiac a few years ago and this was negative.  Prior to all this described most recently her flares would be maybe 2 or 3 days a week and were typically in the evening when she would develop cramping and then about an hour later have diarrhea.  This may just be 1 incident or may last for a few nights in a row.  She does have Dicyclomine which she uses as needed and does help.  She is wondering what else she can do.    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    Denies fever, chills, weight loss or symptoms that awaken her from sleep.  Past Medical History:  Diagnosis Date   ADHD (attention deficit hyperactivity disorder)    Allergy    Anemia    Complication of anesthesia    High risk for malignant hyperthemia d/t myotonia congenita   Frequent headaches    GAD (generalized anxiety  disorder)    Myotonia congenita, autosomal dominant     Past Surgical History:  Procedure Laterality Date   BREAST SURGERY Bilateral 07/2014   reduction   CESAREAN SECTION     has had 2   COLONOSCOPY WITH PROPOFOL N/A 09/22/2022   Procedure: COLONOSCOPY WITH PROPOFOL;  Surgeon: Beverley Fiedler, MD;  [facility];  Service: Gastroenterology;  Laterality: N/A;   COSMETIC SURGERY     breast reduction, Sept 2015   dental implant     ganlgion cyst Bilateral    MANDIBLE SURGERY     NASAL SEPTUM SURGERY  2016   POLYPECTOMY  09/22/2022   Procedure: POLYPECTOMY;  Surgeon: Beverley Fiedler, MD;  Location: [facility];  Service: Gastroenterology;;   prk  2005   REDUCTION MAMMAPLASTY Bilateral    2015    Current Outpatient Medications  Medication Sig Dispense Refill   Amphetamine ER (DYANAVEL XR) 10 MG TBCR Take 1 tablet by mouth daily. 30 tablet 0   B Complex Vitamins (B COMPLEX PO) Take 1 capsule by mouth at bedtime.     Cholecalciferol (VITAMIN D) 125 MCG (5000 UT) CAPS Take 5,000 Units by mouth daily at 12 noon.     EPINEPHrine (EPIPEN 2-PAK) 0.3 mg/0.3 mL IJ SOAJ injection Inject 0.3 mg into the muscle as  needed for anaphylaxis. (Patient not taking: Reported on 01/05/2024) 1 each 1   Ferrous Sulfate (BL IRON PO) Take 26 mg by mouth daily at 12 noon.     fexofenadine (ALLEGRA) 180 MG tablet Take 180 mg by mouth at bedtime.     fluticasone (FLONASE) 50 MCG/ACT nasal spray 1 spray in each nostril Nasally Once a day for 30 days     Lactobacillus Rhamnosus, GG, (CULTURELLE) CAPS Take 1 capsule by mouth daily.     Licorice Deglycyrrhizinated POWD Take 300 mg by mouth at bedtime. DGL (Delycyrrhizinated Licorice Root Extract) Capsule     Magnesium Citrate 125 MG CAPS Take by mouth.     MAGNESIUM PO Take 120 mg by mouth in the morning and at bedtime.     mexiletine (MEXITIL) 150 MG capsule Take 150 mg by mouth 2 (two) times daily. (In the morning & with lunch)     mexiletine (MEXITIL) 200  MG capsule Take 200 mg by mouth at bedtime.     Misc Natural Products (MAGIC MUSHROOM MIX) CAPS Take 1 capsule by mouth in the morning, at noon, and at bedtime. Cordyceps/Reishi/Lion Mane mushroom complex (500 mg-500 mg-500 mg)     Multiple Vitamin (MULTI-VITAMIN) tablet Take 1 tablet by mouth daily.     NIKKI 3-0.02 MG tablet Take 1 tablet by mouth daily.     promethazine-dextromethorphan (PROMETHAZINE-DM) 6.25-15 MG/5ML syrup Take 5 mLs by mouth 4 (four) times daily as needed. 180 mL 0   Taurine (MEGA TAURINE) 1000 MG CAPS Take 1,000 mg by mouth daily at 12 noon.     tirzepatide (ZEPBOUND) 2.5 MG/0.5ML Pen Inject 2.5 mg into the skin once a week. 2 mL 1   TURMERIC PO Take 700 mg by mouth in the morning.     Current Facility-Administered Medications  Medication Dose Route Frequency Provider Last Rate Last Admin   EPINEPHrine (EPI-PEN) injection 0.3 mg  0.3 mg Subcutaneous Once Sheliah Hatch, MD        Allergies as of 01/05/2024 - Review Complete 01/05/2024  Allergen Reaction Noted   Succinylcholine  03/13/2019    Family History  Problem Relation Age of Onset   Alcohol abuse Father    Cancer Father        prostate   Hyperlipidemia Father    Stroke Father    Hypertension Father    Kidney disease Father    Mental illness Father    Diabetes Father    Bipolar disorder Sister    Mental retardation Brother    Breast cancer Maternal Aunt 50   Breast cancer Paternal Aunt 50       dx'd 3 diff times   Cancer Maternal Grandmother        breast   Cancer Paternal Grandmother        breast   Colon cancer Neg Hx    Esophageal cancer Neg Hx    Stomach cancer Neg Hx    Rectal cancer Neg Hx     Social History   Socioeconomic History   Marital status: Married    Spouse name: Not on file   Number of children: 2   Years of education: Not on file   Highest education level: Bachelor's degree (e.g., BA, AB, BS)  Occupational History   Occupation: realtor  Tobacco Use   Smoking  status: Never   Smokeless tobacco: Never  Vaping Use   Vaping status: Never Used  Substance and Sexual Activity   Alcohol use: Yes  Comment: once monthly   Drug use: Not Currently    Types: Marijuana    Comment: quit college   Sexual activity: Yes  Other Topics Concern   Not on file  Social History Narrative   Lives home with husband and 2 kids.  Waldemar Dickens and Aeronautical engineer (realtor).  Education+  [employer].        Patient is right-handed. She lives with her husband and 2 children. She drinks 1-2 large glasses of 1/2-1/2 tea a day. She works out vigorously 3 x week for over an hour.    Social Drivers of Corporate investment banker Strain: Not on file  Food Insecurity: Not on file  Transportation Needs: Not on file  Physical Activity: Not on file  Stress: Not on file  Social Connections: Not on file  Intimate Partner Violence: Not on file    Review of Systems:    Constitutional: No weight loss, fever or chills Skin: No rash Cardiovascular: No chest pain Respiratory: No SOB  Gastrointestinal: See HPI and otherwise negative Genitourinary: No dysuria  Neurological: No headache, dizziness or syncope Musculoskeletal: No new muscle or joint pain Hematologic: No bleeding  Psychiatric: No history of depression or anxiety   Physical Exam:  Vital signs: BP 100/68 (BP Location: Left Arm, Patient Position: Sitting, Cuff Size: Large)   Pulse 100   Ht 5\' 3"  (1.6 m)   Wt 189 lb 8 oz (86 kg)   BMI 33.57 kg/m    Constitutional:   Very Pleasant overweight Caucasian female appears to be in NAD, Well developed, Well nourished, alert and cooperative Head:  Normocephalic and atraumatic. Eyes:   PEERL, EOMI. No icterus. Conjunctiva pink. Ears:  Normal auditory acuity. Neck:  Supple Throat: Oral cavity and pharynx without inflammation, swelling or lesion.  Respiratory: Respirations even and unlabored. Lungs clear to auscultation bilaterally.   No wheezes, crackles, or rhonchi.   Cardiovascular: Normal S1, S2. No MRG. Regular rate and rhythm. No peripheral edema, cyanosis or pallor.  Gastrointestinal:  Soft, nondistended, nontender. No rebound or guarding. Normal bowel sounds. No appreciable masses or hepatomegaly. Rectal:  Not performed.  Msk:  Symmetrical without gross deformities. Without edema, no deformity or joint abnormality.  Neurologic:  Alert and  oriented x4;  grossly normal neurologically.  Skin:   Dry and intact without significant lesions or rashes. Psychiatric: Demonstrates good judgement and reason without abnormal affect or behaviors.  RELEVANT LABS AND IMAGING: CBC    Component Value Date/Time   WBC 4.9 11/29/2023 2159   RBC 3.63 (L) 11/29/2023 2159   HGB 10.7 (L) 11/29/2023 2159   HCT 31.9 (L) 11/29/2023 2159   PLT 212 11/29/2023 2159   MCV 87.9 11/29/2023 2159   MCH 29.5 11/29/2023 2159   MCHC 33.5 11/29/2023 2159   RDW 11.9 11/29/2023 2159   LYMPHSABS 0.7 11/29/2023 2159   MONOABS 0.4 11/29/2023 2159   EOSABS 0.0 11/29/2023 2159   BASOSABS 0.0 11/29/2023 2159    CMP     Component Value Date/Time   NA 138 11/29/2023 2159   K 3.9 11/29/2023 2159   CL 107 11/29/2023 2159   CO2 25 11/29/2023 2159   GLUCOSE 118 (H) 11/29/2023 2159   BUN 15 11/29/2023 2159   CREATININE 0.88 11/29/2023 2159   CREATININE 0.92 08/31/2016 1519   CALCIUM 8.1 (L) 11/29/2023 2159   PROT 6.8 09/24/2023 0923   ALBUMIN 3.9 09/24/2023 0923   AST 18 09/24/2023 0923   ALT 18 09/24/2023 0923  ALKPHOS 34 (L) 09/24/2023 0923   BILITOT 0.3 09/24/2023 0923   GFRNONAA >60 11/29/2023 2159    Assessment: 1.  IBS-D: Diagnosed many years ago, has recently noticed a link between her symptoms and gluten, typically uses Dicyclomine 20 mg as needed, wondering what else she can do, most recent colonoscopy October 23 with 1 hyperplastic polyp and repeat recommended 10 years  Plan: 1.  Discussed the low FODMAP diet.  Gave her a copy of this.  Explained that at first I  would try a gluten-free diet to see if she is possibly sensitive to gluten.  If so then she knows her answer.  If that this does not work fully and she still has symptoms then could consider going on the full low FODMAP diet.  If this does not work for her could trial scheduling Dicyclomine with a max of 20 mg 4 times daily, 20 to 30 minutes before meals and at bedtime. 2.  Patient will let me know how she is doing. 3.  Patient to follow in clinic with [place] as needed.  Hyacinth Meeker, PA-C [facility] 01/05/2024, 9:00 AM  Cc: Sheliah Hatch, MD

## 2024-01-06 ENCOUNTER — Other Ambulatory Visit: Payer: Self-pay | Admitting: Family Medicine

## 2024-01-06 ENCOUNTER — Other Ambulatory Visit (HOSPITAL_BASED_OUTPATIENT_CLINIC_OR_DEPARTMENT_OTHER): Payer: Self-pay

## 2024-01-06 MED ORDER — ZEPBOUND 5 MG/0.5ML ~~LOC~~ SOAJ
5.0000 mg | SUBCUTANEOUS | 1 refills | Status: DC
  Filled 2024-01-06 (×2): qty 2, 28d supply, fill #0
  Filled 2024-02-05 – 2024-02-12 (×4): qty 2, 28d supply, fill #1

## 2024-01-06 MED ORDER — ZEPBOUND 5 MG/0.5ML ~~LOC~~ SOLN
5.0000 mg | SUBCUTANEOUS | 1 refills | Status: DC
  Filled 2024-01-06: qty 2, 28d supply, fill #0

## 2024-01-06 NOTE — Progress Notes (Signed)
Prescription changed from vial to pen.

## 2024-01-06 NOTE — Addendum Note (Signed)
Addended by: Ester Rink on: 01/06/2024 11:11 AM   Modules accepted: Orders

## 2024-02-07 ENCOUNTER — Other Ambulatory Visit (HOSPITAL_BASED_OUTPATIENT_CLINIC_OR_DEPARTMENT_OTHER): Payer: Self-pay

## 2024-02-09 ENCOUNTER — Other Ambulatory Visit (HOSPITAL_BASED_OUTPATIENT_CLINIC_OR_DEPARTMENT_OTHER): Payer: Self-pay

## 2024-02-12 ENCOUNTER — Other Ambulatory Visit (HOSPITAL_BASED_OUTPATIENT_CLINIC_OR_DEPARTMENT_OTHER): Payer: Self-pay

## 2024-02-15 ENCOUNTER — Other Ambulatory Visit: Payer: Self-pay | Admitting: Family Medicine

## 2024-02-15 MED ORDER — DYANAVEL XR 10 MG PO TBCR: 1.0000 | EXTENDED_RELEASE_TABLET | Freq: Every day | ORAL | 0 refills | Status: DC

## 2024-02-15 NOTE — Telephone Encounter (Signed)
 Request for this Rx to be sent to mail order pharmacy please advise

## 2024-03-10 ENCOUNTER — Other Ambulatory Visit: Payer: Self-pay | Admitting: Family Medicine

## 2024-03-13 ENCOUNTER — Other Ambulatory Visit (HOSPITAL_BASED_OUTPATIENT_CLINIC_OR_DEPARTMENT_OTHER): Payer: Self-pay

## 2024-03-13 MED ORDER — ZEPBOUND 5 MG/0.5ML ~~LOC~~ SOAJ
5.0000 mg | SUBCUTANEOUS | 1 refills | Status: DC
  Filled 2024-03-13: qty 2, 28d supply, fill #0
  Filled 2024-03-31 – 2024-04-05 (×2): qty 2, 28d supply, fill #1

## 2024-03-31 ENCOUNTER — Other Ambulatory Visit: Payer: Self-pay | Admitting: Family Medicine

## 2024-04-01 ENCOUNTER — Other Ambulatory Visit (HOSPITAL_BASED_OUTPATIENT_CLINIC_OR_DEPARTMENT_OTHER): Payer: Self-pay

## 2024-04-03 MED ORDER — DYANAVEL XR 10 MG PO TBCR: 1.0000 | EXTENDED_RELEASE_TABLET | Freq: Every day | ORAL | 0 refills | Status: DC

## 2024-04-03 NOTE — Telephone Encounter (Signed)
 Requested Prescriptions   Pending Prescriptions Disp Refills   Amphetamine  ER (DYANAVEL  XR) 10 MG TBCR 30 tablet 0    Sig: Take 1 tablet by mouth daily.     Date of patient request: 03/31/2024 Last office visit: 12/09/2023 Upcoming visit: Visit date not found Date of last refill: 02/15/2024 Last refill amount: 30

## 2024-04-05 ENCOUNTER — Other Ambulatory Visit (HOSPITAL_BASED_OUTPATIENT_CLINIC_OR_DEPARTMENT_OTHER): Payer: Self-pay

## 2024-05-02 ENCOUNTER — Other Ambulatory Visit: Payer: Self-pay | Admitting: Family Medicine

## 2024-05-02 ENCOUNTER — Other Ambulatory Visit (HOSPITAL_BASED_OUTPATIENT_CLINIC_OR_DEPARTMENT_OTHER): Payer: Self-pay

## 2024-05-02 MED ORDER — ZEPBOUND 5 MG/0.5ML ~~LOC~~ SOAJ
5.0000 mg | SUBCUTANEOUS | 1 refills | Status: DC
  Filled 2024-05-02: qty 2, 28d supply, fill #0
  Filled 2024-05-28 – 2024-06-13 (×3): qty 2, 28d supply, fill #1

## 2024-05-29 ENCOUNTER — Other Ambulatory Visit (HOSPITAL_BASED_OUTPATIENT_CLINIC_OR_DEPARTMENT_OTHER): Payer: Self-pay

## 2024-06-01 ENCOUNTER — Other Ambulatory Visit (HOSPITAL_BASED_OUTPATIENT_CLINIC_OR_DEPARTMENT_OTHER): Payer: Self-pay

## 2024-06-01 ENCOUNTER — Telehealth: Payer: Self-pay

## 2024-06-01 NOTE — Telephone Encounter (Signed)
 Copied from CRM [id number]. Topic: Clinical - Medication Prior Auth >> Jun 01, 2024 12:30 PM Chasity T wrote: Reason for CRM: Patient is stating that medication tirzepatide  (ZEPBOUND ) 5 MG/0.5ML Pen Is needing to be signed as a pior auth again for her to pick up.

## 2024-06-01 NOTE — Telephone Encounter (Signed)
 Patient stating she needs a new PA, can you please look into this?

## 2024-06-02 ENCOUNTER — Other Ambulatory Visit (HOSPITAL_COMMUNITY): Payer: Self-pay

## 2024-06-02 ENCOUNTER — Other Ambulatory Visit (HOSPITAL_BASED_OUTPATIENT_CLINIC_OR_DEPARTMENT_OTHER): Payer: Self-pay

## 2024-06-02 ENCOUNTER — Telehealth: Payer: Self-pay

## 2024-06-02 NOTE — Telephone Encounter (Signed)
 Pharmacy Patient Advocate Encounter   Received notification from Pt Calls Messages that prior authorization for Zepbound  5MG /0.5ML is required/requested.   Insurance verification completed.   The patient is insured through [facility] .   Per test claim:  USE ORLISTAT, QSYMIA ,SAXENDA , WEGOVY   is preferred by the insurance.  If suggested medication is appropriate, Please send in a new RX and discontinue this one. If not, please advise as to why it's not appropriate so that we may request a Prior Authorization. Please note, some preferred medications may still require a PA.  If the suggested medications have not been trialed and there are no contraindications to their use, the PA will not be submitted, as it will not be approved.

## 2024-06-02 NOTE — Telephone Encounter (Signed)
 Is either Dr Jerrell or Dr Levora willing to advise on this so that the patient does not wait 3 weeks unless necessary?

## 2024-06-02 NOTE — Telephone Encounter (Signed)
 I spoke with the patient by phone.  She started Zepbound  in February of this year and has lost 35 pounds as a result.  She is on the 5 mg weekly dosing.  Tolerating it really well with no side effects.  She has prediabetes.  I offered her a change to Wegovy , but she declines that for now.  She prefers to remain on Zepbound  because it has been successful for her.  I advised that she could call her insurance and ask to initiate an appeal.  Our office will help as able.  She is going to consider.

## 2024-06-02 NOTE — Telephone Encounter (Signed)
 Dr Jerrell discussed with the patient and if at all possible wants to stick with Zepbound 

## 2024-06-02 NOTE — Telephone Encounter (Signed)
 Please advise what is the best course of action with the preferred meds verse the requested medication

## 2024-06-03 ENCOUNTER — Encounter: Payer: Self-pay | Admitting: Family Medicine

## 2024-06-03 ENCOUNTER — Other Ambulatory Visit (HOSPITAL_BASED_OUTPATIENT_CLINIC_OR_DEPARTMENT_OTHER): Payer: Self-pay

## 2024-06-03 MED ORDER — WEGOVY 0.5 MG/0.5ML ~~LOC~~ SOAJ
0.5000 mg | SUBCUTANEOUS | 3 refills | Status: DC
  Filled 2024-06-03: qty 2, 28d supply, fill #0

## 2024-06-03 MED ORDER — WEGOVY 0.5 MG/0.5ML ~~LOC~~ SOAJ: 0.5000 mg | SUBCUTANEOUS | 3 refills | Status: DC

## 2024-06-05 ENCOUNTER — Encounter: Payer: Self-pay | Admitting: Family Medicine

## 2024-06-13 ENCOUNTER — Other Ambulatory Visit (HOSPITAL_COMMUNITY): Payer: Self-pay

## 2024-06-14 NOTE — Telephone Encounter (Signed)
 Patient responded with an update on Wegovy . It is still causing nausea. He would like to change back to Zepbound 

## 2024-06-27 ENCOUNTER — Other Ambulatory Visit (HOSPITAL_COMMUNITY): Payer: Self-pay

## 2024-06-27 ENCOUNTER — Telehealth: Payer: Self-pay

## 2024-06-27 NOTE — Telephone Encounter (Signed)
 Included message with Wegovy  fail  Pharmacy Patient Advocate Encounter   Received notification from CoverMyMeds that prior authorization for Zepbound  5 is required/requested.   Insurance verification completed.   The patient is insured through [facility] .   Per test claim: PA required; PA submitted to above mentioned insurance via CoverMyMeds Key/confirmation #/EOC [id number] Status is pending

## 2024-06-27 NOTE — Telephone Encounter (Signed)
 FYI patient sent a MyChart message stating that her insurance company will send a PA for possibly monjouro?

## 2024-06-28 ENCOUNTER — Ambulatory Visit (HOSPITAL_BASED_OUTPATIENT_CLINIC_OR_DEPARTMENT_OTHER)
Admission: EM | Admit: 2024-06-28 | Discharge: 2024-06-28 | Disposition: A | Discharge status: Home / Self Care | Attending: Surgery | Admitting: Surgery

## 2024-06-28 ENCOUNTER — Other Ambulatory Visit (HOSPITAL_COMMUNITY): Payer: Self-pay

## 2024-06-28 ENCOUNTER — Encounter (HOSPITAL_BASED_OUTPATIENT_CLINIC_OR_DEPARTMENT_OTHER): Payer: Self-pay | Admitting: Emergency Medicine

## 2024-06-28 ENCOUNTER — Emergency Department (HOSPITAL_BASED_OUTPATIENT_CLINIC_OR_DEPARTMENT_OTHER)

## 2024-06-28 ENCOUNTER — Encounter (HOSPITAL_COMMUNITY)
Admission: EM | Disposition: A | Discharge status: Home / Self Care | Payer: Self-pay | Source: Home / Self Care | Attending: Emergency Medicine

## 2024-06-28 ENCOUNTER — Other Ambulatory Visit: Payer: Self-pay

## 2024-06-28 ENCOUNTER — Emergency Department (HOSPITAL_COMMUNITY)

## 2024-06-28 DIAGNOSIS — D759 Disease of blood and blood-forming organs, unspecified: Secondary | ICD-10-CM | POA: Diagnosis not present

## 2024-06-28 DIAGNOSIS — R1031 Right lower quadrant pain: Secondary | ICD-10-CM | POA: Diagnosis present

## 2024-06-28 DIAGNOSIS — G7112 Myotonia congenita: Secondary | ICD-10-CM | POA: Insufficient documentation

## 2024-06-28 DIAGNOSIS — D649 Anemia, unspecified: Secondary | ICD-10-CM | POA: Diagnosis not present

## 2024-06-28 DIAGNOSIS — K353 Acute appendicitis with localized peritonitis, without perforation or gangrene: Secondary | ICD-10-CM | POA: Insufficient documentation

## 2024-06-28 DIAGNOSIS — F419 Anxiety disorder, unspecified: Secondary | ICD-10-CM

## 2024-06-28 DIAGNOSIS — Z79899 Other long term (current) drug therapy: Secondary | ICD-10-CM | POA: Diagnosis not present

## 2024-06-28 DIAGNOSIS — F909 Attention-deficit hyperactivity disorder, unspecified type: Secondary | ICD-10-CM | POA: Diagnosis not present

## 2024-06-28 DIAGNOSIS — F411 Generalized anxiety disorder: Secondary | ICD-10-CM | POA: Insufficient documentation

## 2024-06-28 DIAGNOSIS — K37 Unspecified appendicitis: Secondary | ICD-10-CM

## 2024-06-28 HISTORY — PX: LAPAROSCOPIC APPENDECTOMY: SHX408

## 2024-06-28 LAB — URINALYSIS, ROUTINE W REFLEX MICROSCOPIC
Bilirubin Urine: NEGATIVE
Glucose, UA: NEGATIVE mg/dL
Hgb urine dipstick: NEGATIVE
Leukocytes,Ua: NEGATIVE
Nitrite: NEGATIVE
Protein, ur: NEGATIVE mg/dL
Specific Gravity, Urine: 1.028 (ref 1.005–1.030)
pH: 5.5 (ref 5.0–8.0)

## 2024-06-28 LAB — POCT PREGNANCY, URINE: Preg Test, Ur: NEGATIVE

## 2024-06-28 LAB — CBC WITH DIFFERENTIAL/PLATELET
Abs Immature Granulocytes: 0.07 K/uL (ref 0.00–0.07)
Basophils Absolute: 0 K/uL (ref 0.0–0.1)
Basophils Relative: 0 %
Eosinophils Absolute: 0.1 K/uL (ref 0.0–0.5)
Eosinophils Relative: 1 %
HCT: 38.3 % (ref 36.0–46.0)
Hemoglobin: 12.8 g/dL (ref 12.0–15.0)
Immature Granulocytes: 1 %
Lymphocytes Relative: 13 %
Lymphs Abs: 2 K/uL (ref 0.7–4.0)
MCH: 30.3 pg (ref 26.0–34.0)
MCHC: 33.4 g/dL (ref 30.0–36.0)
MCV: 90.8 fL (ref 80.0–100.0)
Monocytes Absolute: 0.7 K/uL (ref 0.1–1.0)
Monocytes Relative: 4 %
Neutro Abs: 12.4 K/uL — ABNORMAL HIGH (ref 1.7–7.7)
Neutrophils Relative %: 81 %
Platelets: 261 K/uL (ref 150–400)
RBC: 4.22 MIL/uL (ref 3.87–5.11)
RDW: 12.2 % (ref 11.5–15.5)
WBC: 15.2 K/uL — ABNORMAL HIGH (ref 4.0–10.5)
nRBC: 0 % (ref 0.0–0.2)

## 2024-06-28 LAB — COMPREHENSIVE METABOLIC PANEL WITH GFR
ALT: 31 U/L (ref 0–44)
AST: 39 U/L (ref 15–41)
Albumin: 4.4 g/dL (ref 3.5–5.0)
Alkaline Phosphatase: 41 U/L (ref 38–126)
Anion gap: 13 (ref 5–15)
BUN: 10 mg/dL (ref 6–20)
CO2: 21 mmol/L — ABNORMAL LOW (ref 22–32)
Calcium: 9.6 mg/dL (ref 8.9–10.3)
Chloride: 104 mmol/L (ref 98–111)
Creatinine, Ser: 0.97 mg/dL (ref 0.44–1.00)
GFR, Estimated: >60 mL/min (ref >60)
Glucose, Bld: 94 mg/dL (ref 70–99)
Potassium: 4 mmol/L (ref 3.5–5.1)
Sodium: 139 mmol/L (ref 135–145)
Total Bilirubin: 0.6 mg/dL (ref 0.0–1.2)
Total Protein: 7.1 g/dL (ref 6.5–8.1)

## 2024-06-28 LAB — LIPASE, BLOOD: Lipase: 26 U/L (ref 11–51)

## 2024-06-28 SURGERY — APPENDECTOMY, LAPAROSCOPIC
Anesthesia: General

## 2024-06-28 MED ORDER — ONDANSETRON HCL 4 MG/2ML IJ SOLN
INTRAMUSCULAR | Status: AC
  Filled 2024-06-28: qty 2

## 2024-06-28 MED ORDER — METRONIDAZOLE 500 MG/100ML IV SOLN
500.0000 mg | Freq: Once | INTRAVENOUS | Status: AC
  Administered 2024-06-28: 500 mg via INTRAVENOUS

## 2024-06-28 MED ORDER — SODIUM CHLORIDE 0.9 % IV SOLN
2.0000 g | Freq: Once | INTRAVENOUS | Status: AC
  Administered 2024-06-28: 2 g via INTRAVENOUS

## 2024-06-28 MED ORDER — SODIUM CHLORIDE 0.9 % IR SOLN
Status: DC | PRN
  Administered 2024-06-28: 1000 mL

## 2024-06-28 MED ORDER — ROCURONIUM BROMIDE 10 MG/ML (PF) SYRINGE
PREFILLED_SYRINGE | INTRAVENOUS | Status: DC | PRN
  Administered 2024-06-28: 20 mg via INTRAVENOUS
  Administered 2024-06-28: 40 mg via INTRAVENOUS

## 2024-06-28 MED ORDER — FENTANYL CITRATE (PF) 250 MCG/5ML IJ SOLN
INTRAMUSCULAR | Status: DC | PRN
  Administered 2024-06-28: 50 ug via INTRAVENOUS
  Administered 2024-06-28: 100 ug via INTRAVENOUS
  Administered 2024-06-28 (×2): 50 ug via INTRAVENOUS

## 2024-06-28 MED ORDER — 0.9 % SODIUM CHLORIDE (POUR BTL) OPTIME
TOPICAL | Status: DC | PRN
Start: 2024-06-28 — End: 2024-06-28
  Administered 2024-06-28: 1000 mL

## 2024-06-28 MED ORDER — DEXAMETHASONE SODIUM PHOSPHATE 10 MG/ML IJ SOLN
INTRAMUSCULAR | Status: AC
  Filled 2024-06-28: qty 1

## 2024-06-28 MED ORDER — LIDOCAINE 2% (20 MG/ML) 5 ML SYRINGE
INTRAMUSCULAR | Status: DC | PRN
  Administered 2024-06-28: 60 mg via INTRAVENOUS

## 2024-06-28 MED ORDER — SODIUM CHLORIDE 0.9 % IV SOLN
INTRAVENOUS | Status: AC
  Filled 2024-06-28: qty 20

## 2024-06-28 MED ORDER — AMISULPRIDE (ANTIEMETIC) 5 MG/2ML IV SOLN
INTRAVENOUS | Status: AC
  Filled 2024-06-28: qty 4

## 2024-06-28 MED ORDER — BUPIVACAINE-EPINEPHRINE 0.25% -1:200000 IJ SOLN
INTRAMUSCULAR | Status: DC | PRN
  Administered 2024-06-28: 16 mL

## 2024-06-28 MED ORDER — SUGAMMADEX SODIUM 200 MG/2ML IV SOLN
INTRAVENOUS | Status: DC | PRN
  Administered 2024-06-28: 20 mg via INTRAVENOUS

## 2024-06-28 MED ORDER — PROPOFOL 10 MG/ML IV BOLUS
INTRAVENOUS | Status: DC | PRN
  Administered 2024-06-28: 140 mg via INTRAVENOUS

## 2024-06-28 MED ORDER — OXYCODONE HCL 5 MG PO TABS
ORAL_TABLET | ORAL | Status: AC
  Filled 2024-06-28: qty 1

## 2024-06-28 MED ORDER — OXYCODONE HCL 5 MG PO TABS
5.0000 mg | ORAL_TABLET | ORAL | Status: DC | PRN
  Administered 2024-06-28: 5 mg via ORAL

## 2024-06-28 MED ORDER — DEXAMETHASONE SODIUM PHOSPHATE 10 MG/ML IJ SOLN
INTRAMUSCULAR | Status: DC | PRN
  Administered 2024-06-28: 10 mg via INTRAVENOUS

## 2024-06-28 MED ORDER — MIDAZOLAM HCL 2 MG/2ML IJ SOLN
INTRAMUSCULAR | Status: DC | PRN
  Administered 2024-06-28: 2 mg via INTRAVENOUS

## 2024-06-28 MED ORDER — BUPIVACAINE-EPINEPHRINE (PF) 0.25% -1:200000 IJ SOLN
INTRAMUSCULAR | Status: AC
  Filled 2024-06-28: qty 30

## 2024-06-28 MED ORDER — IOHEXOL 300 MG/ML  SOLN
100.0000 mL | Freq: Once | INTRAMUSCULAR | Status: AC | PRN
  Administered 2024-06-28: 100 mL via INTRAVENOUS

## 2024-06-28 MED ORDER — CHLORHEXIDINE GLUCONATE 0.12 % MT SOLN
15.0000 mL | OROMUCOSAL | Status: AC
  Administered 2024-06-28: 15 mL via OROMUCOSAL
  Filled 2024-06-28: qty 15

## 2024-06-28 MED ORDER — MIDAZOLAM HCL 2 MG/2ML IJ SOLN
INTRAMUSCULAR | Status: AC
  Filled 2024-06-28: qty 2

## 2024-06-28 MED ORDER — METRONIDAZOLE 500 MG/100ML IV SOLN
INTRAVENOUS | Status: AC
  Filled 2024-06-28: qty 100

## 2024-06-28 MED ORDER — FENTANYL CITRATE (PF) 250 MCG/5ML IJ SOLN
INTRAMUSCULAR | Status: AC
  Filled 2024-06-28: qty 5

## 2024-06-28 MED ORDER — MORPHINE SULFATE (PF) 4 MG/ML IV SOLN
4.0000 mg | Freq: Once | INTRAVENOUS | Status: AC
  Administered 2024-06-28: 4 mg via INTRAVENOUS
  Filled 2024-06-28: qty 1

## 2024-06-28 MED ORDER — ONDANSETRON HCL 4 MG/2ML IJ SOLN
4.0000 mg | Freq: Once | INTRAMUSCULAR | Status: AC
Start: 2024-06-28 — End: 2024-06-28
  Administered 2024-06-28: 4 mg via INTRAVENOUS
  Filled 2024-06-28: qty 2

## 2024-06-28 MED ORDER — LACTATED RINGERS IV SOLN: INTRAVENOUS | Status: DC

## 2024-06-28 MED ORDER — AMISULPRIDE (ANTIEMETIC) 5 MG/2ML IV SOLN
10.0000 mg | Freq: Once | INTRAVENOUS | Status: AC
  Administered 2024-06-28: 10 mg via INTRAVENOUS

## 2024-06-28 MED ORDER — ONDANSETRON HCL 4 MG/2ML IJ SOLN
INTRAMUSCULAR | Status: DC | PRN
  Administered 2024-06-28: 4 mg via INTRAVENOUS

## 2024-06-28 MED ORDER — SODIUM CHLORIDE 0.9 % IV BOLUS
1000.0000 mL | Freq: Once | INTRAVENOUS | Status: AC
  Administered 2024-06-28: 1000 mL via INTRAVENOUS

## 2024-06-28 MED ORDER — PROPOFOL 500 MG/50ML IV EMUL
INTRAVENOUS | Status: DC | PRN
Start: 2024-06-28 — End: 2024-06-28
  Administered 2024-06-28: 120 ug/kg/min via INTRAVENOUS

## 2024-06-28 MED ORDER — OXYCODONE HCL 5 MG PO TABS: 5.0000 mg | ORAL_TABLET | Freq: Four times a day (QID) | ORAL | 0 refills | Status: AC | PRN

## 2024-06-28 MED ORDER — FENTANYL CITRATE PF 50 MCG/ML IJ SOSY
50.0000 ug | PREFILLED_SYRINGE | Freq: Once | INTRAMUSCULAR | Status: AC
  Administered 2024-06-28: 50 ug via INTRAVENOUS
  Filled 2024-06-28: qty 1

## 2024-06-28 SURGICAL SUPPLY — 31 items
BAG COUNTER SPONGE SURGICOUNT (BAG) ×2 IMPLANT
CANISTER SUCTION 3000ML PPV (SUCTIONS) ×2 IMPLANT
CHLORAPREP W/TINT 26 (MISCELLANEOUS) ×2 IMPLANT
CLIP APPLIE 5 13 M/L LIGAMAX5 (MISCELLANEOUS) IMPLANT
COVER SURGICAL LIGHT HANDLE (MISCELLANEOUS) ×2 IMPLANT
CUTTER FLEX LINEAR 45M (STAPLE) IMPLANT
DERMABOND ADVANCED .7 DNX12 (GAUZE/BANDAGES/DRESSINGS) ×2 IMPLANT
ELECTRODE REM PT RTRN 9FT ADLT (ELECTROSURGICAL) ×2 IMPLANT
GLOVE BIOGEL PI IND STRL 6 (GLOVE) ×2 IMPLANT
GLOVE BIOGEL PI MICRO STRL 5.5 (GLOVE) ×2 IMPLANT
GOWN STRL REUS W/ TWL LRG LVL3 (GOWN DISPOSABLE) ×6 IMPLANT
KIT BASIN OR (CUSTOM PROCEDURE TRAY) ×2 IMPLANT
KIT TURNOVER KIT B (KITS) ×2 IMPLANT
NS IRRIG 1000ML POUR BTL (IV SOLUTION) ×2 IMPLANT
PAD ARMBOARD POSITIONER FOAM (MISCELLANEOUS) ×4 IMPLANT
PENCIL BUTTON HOLSTER BLD 10FT (ELECTRODE) ×2 IMPLANT
RELOAD STAPLE 45 3.5 BLU ETS (ENDOMECHANICALS) IMPLANT
SET TUBE SMOKE EVAC HIGH FLOW (TUBING) ×2 IMPLANT
SHEARS HARMONIC 36 ACE (MISCELLANEOUS) IMPLANT
SLEEVE Z-THREAD 5X100MM (TROCAR) ×2 IMPLANT
SPECIMEN JAR SMALL (MISCELLANEOUS) ×2 IMPLANT
SUT MNCRL AB 4-0 PS2 18 (SUTURE) ×2 IMPLANT
SUT VICRYL 0 UR6 27IN ABS (SUTURE) IMPLANT
SYSTEM BAG RETRIEVAL 10MM (BASKET) ×2 IMPLANT
TOWEL GREEN STERILE (TOWEL DISPOSABLE) ×2 IMPLANT
TOWEL GREEN STERILE FF (TOWEL DISPOSABLE) ×2 IMPLANT
TRAY LAPAROSCOPIC MC (CUSTOM PROCEDURE TRAY) ×2 IMPLANT
TROCAR BALLN 12MMX100 BLUNT (TROCAR) ×2 IMPLANT
TROCAR Z-THREAD OPTICAL 5X100M (TROCAR) ×2 IMPLANT
WARMER LAPAROSCOPE (MISCELLANEOUS) ×2 IMPLANT
WATER STERILE IRR 1000ML POUR (IV SOLUTION) ×2 IMPLANT

## 2024-06-28 NOTE — Transfer of Care (Signed)
 Immediate Anesthesia Transfer of Care Note  Patient: Lori Zavala  Procedure(s) Performed: APPENDECTOMY, LAPAROSCOPIC  Patient Location: PACU  Anesthesia Type:General  Level of Consciousness: awake, alert , and oriented  Airway & Oxygen Therapy: Patient Spontanous Breathing and Patient connected to face mask oxygen  Post-op Assessment: Report given to RN and Post -op Vital signs reviewed and stable  Post vital signs: Reviewed and stable  Last Vitals:  Vitals Value Taken Time  BP 123/64 06/28/24 19:30  Temp 36.5 C 06/28/24 19:30  Pulse 86 06/28/24 19:41  Resp 14 06/28/24 19:41  SpO2 97 % 06/28/24 19:41  Vitals shown include unfiled device data.  Last Pain:  Vitals:   06/28/24 1930  TempSrc:   PainSc: Asleep         Complications: No notable events documented.

## 2024-06-28 NOTE — Anesthesia Procedure Notes (Signed)
 Procedure Name: Intubation Date/Time: 06/28/2024 6:22 PM  Performed by: Julien Manus, CRNAPre-anesthesia Checklist: Patient identified, Emergency Drugs available, Suction available and Patient being monitored Patient Re-evaluated:Patient Re-evaluated prior to induction Oxygen Delivery Method: Circle System Utilized Preoxygenation: Pre-oxygenation with 100% oxygen Induction Type: IV induction Ventilation: Mask ventilation without difficulty Grade View: Grade I Tube type: Oral Tube size: 7.0 mm Number of attempts: 1 Airway Equipment and Method: Stylet and Oral airway Placement Confirmation: ETT inserted through vocal cords under direct vision, positive ETCO2 and breath sounds checked- equal and bilateral Secured at: 21 cm Tube secured with: Tape Dental Injury: Teeth and Oropharynx as per pre-operative assessment

## 2024-06-28 NOTE — Discharge Instructions (Addendum)

## 2024-06-28 NOTE — ED Provider Notes (Signed)
 [place] EMERGENCY DEPARTMENT AT [facility] Provider Note   CSN: [id number] Arrival date & time: 06/28/24  1148     Patient presents with: Abdominal Pain   Lori Zavala is a 48 y.o. female.   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  She describes pain that began at her back not radiating to the right lower quadrant and localized.  Exacerbated with any type of movement or palpation.  She has had anorexia over the past 24 hours, reports eating peanut butter crackers yesterday.  She also feels somewhat nauseated but has not had any episodes of vomiting.  She does tell me that her resting heart rate is usually elevated but arrives to the ED with a heart rate in the 110s.  She does have prior surgical history to her abdomen with a C-section.  She also tells me that because of the pain she feels that she cannot take a deep breath.  She has not had any fever, no vomiting, no urinary symptoms. Of note patient tells me that she was recently started on Wegovy  4 weeks ago, reports that she has not felt well since starting this medication.  The history is provided by the patient.  Abdominal Pain Pain location:  RLQ Pain quality: cramping   Pain radiates to:  Does not radiate Pain severity:  Severe Onset quality:  Sudden Duration:  1 day Timing:  Constant Progression:  Worsening Chronicity:  New Context: not awakening from sleep, not eating and not laxative use   Associated symptoms: no chest pain, no fever, no shortness of breath and no sore throat        Prior to Admission medications   Medication Sig Start Date End Date Taking? Authorizing Provider  Amphetamine  ER (DYANAVEL  XR) 10 MG TBCR Take 1 tablet by mouth daily. 04/03/24   Tabori, Katherine E, MD  B Complex Vitamins (B COMPLEX PO) Take 1 capsule by mouth at bedtime.    [provider]  Cholecalciferol (VITAMIN D ) 125 MCG (5000 UT) CAPS Take 5,000 Units by mouth daily at 12 noon.    [provider]  EPINEPHrine  (EPIPEN  2-PAK) 0.3 mg/0.3 mL IJ SOAJ injection Inject 0.3 mg into the muscle as needed for anaphylaxis. Patient not taking: Reported on 01/05/2024 12/16/21   Tabori, Katherine E, MD  Ferrous Sulfate (BL IRON PO) Take 26 mg by mouth daily at 12 noon.    [provider]  fexofenadine (ALLEGRA) 180 MG tablet Take 180 mg by mouth at bedtime. 02/12/22   [provider]  fluticasone  (FLONASE ) 50 MCG/ACT nasal spray 1 spray in each nostril Nasally Once a day for 30 days    [provider]  Lactobacillus Rhamnosus, GG, (CULTURELLE) CAPS Take 1 capsule by mouth daily. Patient not taking: Reported on 01/05/2024 07/07/16   [provider]  Licorice Deglycyrrhizinated POWD Take 300 mg by mouth at bedtime. DGL (Delycyrrhizinated Licorice Root Extract) Capsule    [provider]  MAGNESIUM PO Take 120 mg by mouth in the morning and at bedtime.    [provider]  mexiletine (MEXITIL) 150 MG capsule Take 150 mg by mouth 2 (two) times daily. (In the morning & with lunch)    [provider]  mexiletine (MEXITIL) 200 MG capsule Take 200 mg by mouth at bedtime. 07/19/20   [provider]  Misc Natural  Products (MAGIC MUSHROOM MIX) CAPS Take 1 capsule by mouth in the morning, at noon, and at bedtime. Cordyceps/Reishi/Lion Mane mushroom complex (500 mg-500 mg-500 mg)    [provider]  Multiple Vitamin (MULTI-VITAMIN) tablet Take 1 tablet by mouth daily. 12/29/21   [provider]  NIKKI 3-0.02 MG tablet Take 1 tablet by mouth daily. 08/18/23   [provider]  Omega-3 Fatty Acids (OMEGA 3 PO) Take 1 capsule by mouth 2 (two) times daily.    [provider]  Oral Electrolytes (ELECTROLYTE SR) TBCR Take 1 tablet by mouth daily.    [provider]  Taurine (MEGA  TAURINE) 1000 MG CAPS Take 1,000 mg by mouth daily at 12 noon.    [provider]  tirzepatide  (ZEPBOUND ) 5 MG/0.5ML Pen Inject 5 mg into the skin once a week. 05/02/24   Tabori, Katherine E, MD  TURMERIC PO Take 700 mg by mouth in the morning.    [provider]    Allergies: Semaglutide  and Succinylcholine    Review of Systems  Constitutional:  Negative for fever.  HENT:  Negative for sore throat.   Respiratory:  Negative for shortness of breath.   Cardiovascular:  Negative for chest pain.  Gastrointestinal:  Positive for abdominal pain.  All other systems reviewed and are negative.   Updated Vital Signs BP 107/70   Pulse 85   Temp 98 F (36.7 C) (Oral)   Resp 18   SpO2 100%   Physical Exam Vitals and nursing note reviewed.  Constitutional:      Appearance: She is well-developed. She is ill-appearing.  HENT:     Head: Normocephalic and atraumatic.  Cardiovascular:     Rate and Rhythm: Tachycardia present.     Heart sounds: No murmur heard. Pulmonary:     Effort: Pulmonary effort is normal.     Breath sounds: No wheezing.  Abdominal:     General: Abdomen is flat. Bowel sounds are decreased.     Palpations: Abdomen is soft.     Tenderness: There is abdominal tenderness in the right lower quadrant. There is guarding. Positive signs include McBurney's sign.  Skin:    General: Skin is warm and dry.  Neurological:     Mental Status: She is alert and oriented to person, place, and time.     (all labs ordered are listed, but only abnormal results are displayed) Labs Reviewed  CBC WITH DIFFERENTIAL/PLATELET - Abnormal; Notable for the following components:      Result Value   WBC 15.2 (*)    Neutro Abs 12.4 (*)    All other components within normal limits  COMPREHENSIVE METABOLIC PANEL WITH GFR - Abnormal; Notable for the following components:   CO2 21 (*)    All other components within normal limits  URINALYSIS, ROUTINE W REFLEX MICROSCOPIC -  Abnormal; Notable for the following components:   Ketones, ur TRACE (*)    All other components within normal limits  LIPASE, BLOOD    EKG: EKG Interpretation Date/Time:  Wednesday June 28 2024 11:58:47 EDT Ventricular Rate:  93 PR Interval:  134 QRS Duration:  80 QT Interval:  341 QTC Calculation: 425 R Axis:   63  Text Interpretation: Sinus rhythm Right atrial enlargement No significant change since last tracing Confirmed by Dreama Longs ([id number]) on 06/28/2024 2:41:48 PM  Radiology: CT ABDOMEN PELVIS W CONTRAST Result Date: 06/28/2024 CLINICAL DATA:  Right lower quadrant pain EXAM: CT ABDOMEN AND PELVIS WITH CONTRAST TECHNIQUE: Multidetector CT  imaging of the abdomen and pelvis was performed using the standard protocol following bolus administration of intravenous contrast. RADIATION DOSE REDUCTION: This exam was performed according to the departmental dose-optimization program which includes automated exposure control, adjustment of the mA and/or kV according to patient size and/or use of iterative reconstruction technique. CONTRAST:  OMNIPAQUE  IOHEXOL  300 MG/ML  SOLN COMPARISON:  None Available. FINDINGS: Lower chest: No acute abnormality Hepatobiliary: No focal hepatic abnormality. Gallbladder unremarkable. Pancreas: No focal abnormality or ductal dilatation. Spleen: No focal abnormality.  Normal size. Adrenals/Urinary Tract: No adrenal abnormality. No focal renal abnormality. No stones or hydronephrosis. Urinary bladder is unremarkable. Stomach/Bowel: Appendix is mildly prominent measuring up to 8 mm with surrounding haziness. Findings concerning for early acute appendicitis. Stomach, large and small bowel grossly unremarkable. Vascular/Lymphatic: No evidence of aneurysm or adenopathy. Reproductive: Uterus and adnexa unremarkable.  No mass. Other: No free fluid or free air. Musculoskeletal: No acute bony abnormality. IMPRESSION: Slightly dilated appendix with surrounding haziness  concerning for early acute appendicitis. These results were called by telephone at the time of interpretation on 06/28/2024 at 2:21 pm to provider [facility] , who verbally acknowledged these results. Electronically Signed   By: Franky Crease M.D.   On: 06/28/2024 14:23     Procedures   Medications Ordered in the ED  fentaNYL  (SUBLIMAZE ) injection 50 mcg (has no administration in time range)  sodium chloride  0.9 % bolus 1,000 mL (0 mLs Intravenous Stopped 06/28/24 1412)  ondansetron  (ZOFRAN ) injection 4 mg (4 mg Intravenous Given 06/28/24 1254)  morphine  (PF) 4 MG/ML injection 4 mg (4 mg Intravenous Given 06/28/24 1255)  iohexol  (OMNIPAQUE ) 300 MG/ML solution 100 mL (100 mLs Intravenous Contrast Given 06/28/24 1310)    Clinical Course as of 06/28/24 1504  Wed Jun 28, 2024  1422 WBC(!): 15.2 [JS]    Clinical Course User Index [JS] Hayzen Lorenson, PA-C                                 Medical Decision Making Amount and/or Complexity of Data Reviewed Labs: ordered. Decision-making details documented in ED Course. Radiology: ordered.  Risk Prescription drug management.   This patient presents to the ED for concern of right lower quadrant pain, this involves a number of treatment options, and is a complaint that carries with it a high risk of complications and morbidity.  The differential diagnosis includes cholecystitis, appendicitis, renal colic, pyelonephritis versus SBO.   Co morbidities: Discussed in HPI   Brief History:  See HPI  EMR reviewed including pt PMHx, past surgical history and past visits to ER.   See HPI for more details   Lab Tests:  I ordered and independently interpreted labs.  The pertinent results include:    CMP without any electrolyte derangement, LFTs Levels unremarkable.  LFTs are within normal limits.  Her lipase level is normal.  CBC with white blood cell count of 15,000.  UA without any nitrites, leukocytes.  Imaging Studies:  CT Abdomen pelvis  showed: IMPRESSION:  Slightly dilated appendix with surrounding haziness concerning for  early acute appendicitis.    These results were called by telephone at the time of interpretation  on 06/28/2024 at 2:21 pm to provider [facility] , who verbally  acknowledged these results.   Cardiac Monitoring:  The patient was maintained on a cardiac monitor.  I personally viewed and interpreted the cardiac monitored which showed an underlying rhythm of:  NSR EKG non-ischemic   Medicines ordered:  I ordered medication including zofran , morphine   for pain control  Reevaluation of the patient after these medicines showed that the patient improved I have reviewed the patients home medicines and have made adjustments as needed  Consults:  I requested consultation with Ozell Shaper APP,  and discussed lab and imaging findings as well as pertinent plan - they recommend: evaluation in the preop area for appendectomy.   Reevaluation:  After the interventions noted above I re-evaluated patient and found that they have :improved  Social Determinants of Health:  The patient's social determinants of health were a factor in the care of this patient  Problem List / ED Course:  Patient presents to the ED for chief complaint of right lower quadrant pain that began around 10 AM yesterday, waxing and waning but intensified in pain over the past day.  Reports nausea, has not had any appetite as she reports there is too much pain.  Did not take any medication for improvement in symptoms.  Called a friend who is a physician who advised her to be seen in the ED.  Here white blood cell count is 15,000, she is afebrile not tachycardic.  CMP with normal electrolytes.  UA with no nitrites, no leukocytes.  Strong concern for appendicitis as pain is focally around the right lower quadrant exacerbated with lying flat.   I was called by radiology for concerning beginning of appendicitis.  Patient is overall  nontoxic-appearing at this time.  Did discuss these results with patient.  Case was discussed with general surgery APP who will have patient go to preop area in order to get appendectomy.  Last oral intake was around 930 this morning with peanut butter crackers.  She is not anticoagulated.  She remains hemodynamically stable. I received a call from Dr. Leonor Dawn who advised some POV transfer if patient is adamant, patient is agreeable of this at this time.  She feels that her husband can take her over to the preop area.  Given 1 small dose of 50 mcg of fentanyl  to help with pain control.  Hemodynamically stable for transfer.   Dispostion:  After consideration of the diagnostic results and the patients response to treatment, I feel that the patent would benefit from intervention for appendectomy.   Portions of this note were generated with Scientist, clinical (histocompatibility and immunogenetics). Dictation errors may occur despite best attempts at proofreading.   Final diagnoses:  Right lower quadrant pain  Acute appendicitis with localized peritonitis, without perforation or abscess, unspecified whether gangrene present    ED Discharge Orders     None          Maureen Broad, PA-C 06/28/24 1504    Dreama Longs, MD 06/28/24 2224

## 2024-06-28 NOTE — ED Notes (Signed)
 Last oral intake 0930. Report called to Short Stay/Pre-op. Patient headed to [facility] via POV with husband driving.

## 2024-06-28 NOTE — Telephone Encounter (Signed)
 Sent in submission for Zepbound . Approval letter in patient media reads Mounjaro   06/27/24-02/25/25. Ran test claim patient copay for mounjaro  is $25.00

## 2024-06-28 NOTE — Op Note (Signed)
 [name] [id number]   PRE-OPERATIVE DIAGNOSIS:  Acute appendicitis  POST-OPERATIVE DIAGNOSIS:  Acute suppurative appendicitis without perforation  PROCEDURE: Laparoscopic appendectomy  SURGEON:  Lonni Pizza, MD  ASSISTANT: OR Staff  ANESTHESIA: General endotracheal  EBL:   5 mL  DRAINS: None  SPECIMEN:  Appendix  COUNTS:  Sponge, needle and instrument counts were reported correct x2 at conclusion of the operation  DISPOSITION:  PACU in satisfactory condition  COMPLICATIONS: None  FINDINGS: Acutely inflamed suppurative appendicitis without perforation or abscess.  Appendectomy carried out uneventfully.  DESCRIPTION:  The patient was seen in the pre-op holding area. The risks, benefits, complications, treatment options, and expected outcomes were previously discussed with the patient. The patient agreed with the proposed plan and has signed the informed consent form. The patient was brought to the operating room by the surgical team, identified as Lori Zavala, and the procedure verified.  She was placed supine on the operating table and SCD's were applied. General anesthesia was induced without difficulty.  She was positioned supine.  Pressure points were evaluated and padded.  Nursing had reported that she had just voided in preop just prior to her coming into the operating room and therefore, a Foley catheter was not placed. Hair on the lower abdomen was clipped.  She was secured to the operating table. The abdomen was then prepped and draped in the standard sterile fashion. A time out was completed and the above information confirmed and need for preoperative antibiotics.  A small incision was made in the infraumbilical fold. The subcutaneous tissue was dissected and the umbilical stalk identified. The stalk was grasped with a Kocher and retracted outwardly. The infraumbilical fascia was exposed and incised. Peritoneal entry was carefully made bluntly. A 0 Vicryl  purse-string suture was placed and then the [facility] port was introduced into the abdomen.  CO2 insufflation commenced to . The laparoscope was inserted and confirmed no evidence of trocar site complications. The patient was then positioned in Trendelenburg. Two additional ports were placed - one in left lower quadrant and another in the suprapubic midline taking care to stay well above the bladder - 3 fingerbreadths above the pubic symphysis. The bed was then slightly tilted to place the left side down.  The appendix is identified in the right lower quadrant.  It is suppurative and edematous on the midportion and tip but there is no abscess or frank perforation.  The base is relatively spared from the inflammation.  The appendix is carefully freed from surrounding structures bluntly without difficulty. Care was taken to avoid injuring any retroperitoneal structures. The appendix was elevated.  The base of the appendix was circumferentially dissected taking care to preserve the cecum free of injury. The base was noted to be viable and healthy appearing. The terminal ileum, cecum and ascending colon also appeared normal. The mesoappendix is then divided using the harmonic scalpel, maintaining a plane close to the appendix and terminating at our previously created window. The cut edge of the mesoappendix is observed and hemostatic. The base of the appendix was then stapled with a blue load, taking it flush with the cecum, but also steering clear of the ileocecal valve. The appendix was placed in an EndoBag and removed from the umbilical port site and passed off as specimen.  The right lower quadrant was conservatively irrigated. Hemostasis was noted to be achieved - taking time to inspect the ligated mesoappendix. Staple line was noted to be intact on the cecum with well formed  staples and no bleeding. The right lower quadrant appeared clean and as such, no drain was placed.  The left lower quadrant and  suprapubic ports were removed under direct visualization. The CO2 was exhausted from the abdomen. The umbilical fascia was then closed by tieing the 0 Vicryl suture, obliterating the fascial defect. The fascia was then palpated and noted to be completely closed. The skin of all port sites was approximated using 4-0 Monocryl suture. The abdomen is then washed and dried. The incisions are covered with Dermabond.  She was then awakened from anesthesia without any difficulties, extubated, and transferred to a stretcher for transport to PACU in satisfactory condition.

## 2024-06-28 NOTE — ED Notes (Signed)
 Patient going POV--Cancelled [facility]

## 2024-06-28 NOTE — H&P (Signed)
 CC: Acute appendicitis  HPI: Lori Zavala is an 48 y.o. female with hx of ADHD, risk for malignant hyperthermia, GAD, myotonia congenita whom presented to [facility] for evaluation of abdominal pain - right lower quadrant - that began yesterday morning mainly in her back. Subsequently localized to RLQ later. Persistent, severe/sharp. Decreased appetite with associated nausea, denies emesis.   Never had this kind of pain before. No aggrav/allev factors. Has been on Wegovy  and felt unwell since starting, plans to switch back to Zepbound .  Colonoscopy 09/22/22 - Dr. Albertus - 3 mm polyp removed hepatic flexure, otherwise normal. Path - HP polyp.  PSH: C-sx, no other abdominal or pelvic surgical history.  Past Medical History:  Diagnosis Date   ADHD (attention deficit hyperactivity disorder)    Allergy    Anemia    Complication of anesthesia    High risk for malignant hyperthemia d/t myotonia congenita   Frequent headaches    GAD (generalized anxiety disorder)    Myotonia congenita, autosomal dominant     Past Surgical History:  Procedure Laterality Date   BREAST SURGERY Bilateral 07/2014   reduction   CESAREAN SECTION     has had 2   COLONOSCOPY WITH PROPOFOL  N/A 09/22/2022   Procedure: COLONOSCOPY WITH PROPOFOL ;  Surgeon: Albertus Gordy HERO, MD;  Location: [facility];  Service: Gastroenterology;  Laterality: N/A;   COSMETIC SURGERY     breast reduction, Sept 2015   dental implant     ganlgion cyst Bilateral    MANDIBLE SURGERY     NASAL SEPTUM SURGERY  2016   POLYPECTOMY  09/22/2022   Procedure: POLYPECTOMY;  Surgeon: Albertus Gordy HERO, MD;  Location: [facility];  Service: Gastroenterology;;   prk  2005   REDUCTION MAMMAPLASTY Bilateral    2015    Family History  Problem Relation Age of Onset   Alcohol abuse Father    Cancer Father        prostate   Hyperlipidemia Father    Stroke Father    Hypertension Father    Kidney disease Father    Mental illness Father     Diabetes Father    Bipolar disorder Sister    Mental retardation Brother    Breast cancer Maternal Aunt 50   Breast cancer Paternal Aunt 50       dx'd 3 diff times   Cancer Maternal Grandmother        breast   Cancer Paternal Grandmother        breast   Colon cancer Neg Hx    Esophageal cancer Neg Hx    Stomach cancer Neg Hx    Rectal cancer Neg Hx     Social:  reports that she has never smoked. She has never used smokeless tobacco. She reports current alcohol use. She reports that she does not currently use drugs after having used the following drugs: Marijuana.  She is currently working - works as an Airline pilot. Her husband is present at bedside   Allergies:  Allergies  Allergen Reactions   Semaglutide  Nausea And Vomiting   Succinylcholine     At high risk of malignant hyperthermia due to myotonia congenita    Medications: I have reviewed the patient's current medications.  Results for orders placed or performed during the hospital encounter of 06/28/24 (from the past 48 hours)  CBC with Differential     Status: Abnormal   Collection Time: 06/28/24 12:39 PM  Result Value Ref Range   WBC 15.2 (  H) 4.0 - 10.5 K/uL   RBC 4.22 3.87 - 5.11 MIL/uL   Hemoglobin 12.8 12.0 - 15.0 g/dL   HCT 61.6 63.9 - 53.9 %   MCV 90.8 80.0 - 100.0 fL   MCH 30.3 26.0 - 34.0 pg   MCHC 33.4 30.0 - 36.0 g/dL   RDW 87.7 88.4 - 84.4 %   Platelets 261 150 - 400 K/uL   nRBC 0.0 0.0 - 0.2 %   Neutrophils Relative % 81 %   Neutro Abs 12.4 (H) 1.7 - 7.7 K/uL   Lymphocytes Relative 13 %   Lymphs Abs 2.0 0.7 - 4.0 K/uL   Monocytes Relative 4 %   Monocytes Absolute 0.7 0.1 - 1.0 K/uL   Eosinophils Relative 1 %   Eosinophils Absolute 0.1 0.0 - 0.5 K/uL   Basophils Relative 0 %   Basophils Absolute 0.0 0.0 - 0.1 K/uL   Immature Granulocytes 1 %   Abs Immature Granulocytes 0.07 0.00 - 0.07 K/uL    Comment: Performed at [facility], [address] [place], [place] [address]   Comprehensive metabolic panel     Status: Abnormal   Collection Time: 06/28/24 12:39 PM  Result Value Ref Range   Sodium 139 135 - 145 mmol/L   Potassium 4.0 3.5 - 5.1 mmol/L   Chloride 104 98 - 111 mmol/L   CO2 21 (L) 22 - 32 mmol/L   Glucose, Bld 94 70 - 99 mg/dL    Comment: Glucose reference range applies only to samples taken after fasting for at least 8 hours.   BUN 10 6 - 20 mg/dL   Creatinine, Ser 9.02 0.44 - 1.00 mg/dL   Calcium 9.6 8.9 - 89.6 mg/dL   Total Protein 7.1 6.5 - 8.1 g/dL   Albumin 4.4 3.5 - 5.0 g/dL   AST 39 15 - 41 U/L   ALT 31 0 - 44 U/L   Alkaline Phosphatase 41 38 - 126 U/L   Total Bilirubin 0.6 0.0 - 1.2 mg/dL   GFR, Estimated >39 >39 mL/min    Comment: (NOTE) Calculated using the CKD-EPI Creatinine Equation (2021)    Anion gap 13 5 - 15    Comment: Performed at [facility], [address] [place], [place] [address]  Lipase, blood     Status: None   Collection Time: 06/28/24 12:39 PM  Result Value Ref Range   Lipase 26 11 - 51 U/L    Comment: Performed at [facility], [address], [place], [place] [address]  Urinalysis, Routine w reflex microscopic -Urine, Clean Catch     Status: Abnormal   Collection Time: 06/28/24 12:43 PM  Result Value Ref Range   Color, Urine YELLOW YELLOW   APPearance CLEAR CLEAR   Specific Gravity, Urine 1.028 1.005 - 1.030   pH 5.5 5.0 - 8.0   Glucose, UA NEGATIVE NEGATIVE mg/dL   Hgb urine dipstick NEGATIVE NEGATIVE   Bilirubin Urine NEGATIVE NEGATIVE   Ketones, ur TRACE (A) NEGATIVE mg/dL   Protein, ur NEGATIVE NEGATIVE mg/dL   Nitrite NEGATIVE NEGATIVE   Leukocytes,Ua NEGATIVE NEGATIVE    Comment: Performed at [facility], [address] [place], [place] [address]  Pregnancy, urine POC     Status: None   Collection Time: 06/28/24  3:56 PM  Result Value Ref Range   Preg Test, Ur NEGATIVE NEGATIVE    Comment:        THE SENSITIVITY OF  THIS METHODOLOGY IS >20 mIU/mL.  CT ABDOMEN PELVIS W CONTRAST Result Date: 06/28/2024 CLINICAL DATA:  Right lower quadrant pain EXAM: CT ABDOMEN AND PELVIS WITH CONTRAST TECHNIQUE: Multidetector CT imaging of the abdomen and pelvis was performed using the standard protocol following bolus administration of intravenous contrast. RADIATION DOSE REDUCTION: This exam was performed according to the departmental dose-optimization program which includes automated exposure control, adjustment of the mA and/or kV according to patient size and/or use of iterative reconstruction technique. CONTRAST:  OMNIPAQUE  IOHEXOL  300 MG/ML  SOLN COMPARISON:  None Available. FINDINGS: Lower chest: No acute abnormality Hepatobiliary: No focal hepatic abnormality. Gallbladder unremarkable. Pancreas: No focal abnormality or ductal dilatation. Spleen: No focal abnormality.  Normal size. Adrenals/Urinary Tract: No adrenal abnormality. No focal renal abnormality. No stones or hydronephrosis. Urinary bladder is unremarkable. Stomach/Bowel: Appendix is mildly prominent measuring up to 8 mm with surrounding haziness. Findings concerning for early acute appendicitis. Stomach, large and small bowel grossly unremarkable. Vascular/Lymphatic: No evidence of aneurysm or adenopathy. Reproductive: Uterus and adnexa unremarkable.  No mass. Other: No free fluid or free air. Musculoskeletal: No acute bony abnormality. IMPRESSION: Slightly dilated appendix with surrounding haziness concerning for early acute appendicitis. These results were called by telephone at the time of interpretation on 06/28/2024 at 2:21 pm to provider [facility] , who verbally acknowledged these results. Electronically Signed   By: Franky Crease M.D.   On: 06/28/2024 14:23    ROS - all of the below systems have been reviewed with the patient and positives are indicated with bold text General: chills, fever or night sweats Eyes: blurry vision or double vision ENT:  epistaxis or sore throat Allergy/Immunology: itchy/watery eyes or nasal congestion Hematologic/Lymphatic: bleeding problems, blood clots or swollen lymph nodes Endocrine: temperature intolerance or unexpected weight changes Breast: new or changing breast lumps or nipple discharge Resp: cough, shortness of breath, or wheezing CV: chest pain or dyspnea on exertion GI: as per HPI GU: dysuria, trouble voiding, or hematuria MSK: joint pain or joint stiffness Neuro: TIA or stroke symptoms Derm: pruritus and skin lesion changes Psych: anxiety and depression  PE Blood pressure 107/70, pulse 85, temperature 98 F (36.7 C), temperature source Oral, resp. rate 18, SpO2 100%. Constitutional: NAD; conversant Eyes: Moist conjunctiva Neck: Trachea midline Lungs: Normal respiratory effort CV: RRR; no pitting edema GI: Abd soft, NT/ND MSK: Normal range of motion of extremities Psychiatric: Appropriate affect  Results for orders placed or performed during the hospital encounter of 06/28/24 (from the past 48 hours)  CBC with Differential     Status: Abnormal   Collection Time: 06/28/24 12:39 PM  Result Value Ref Range   WBC 15.2 (H) 4.0 - 10.5 K/uL   RBC 4.22 3.87 - 5.11 MIL/uL   Hemoglobin 12.8 12.0 - 15.0 g/dL   HCT 61.6 63.9 - 53.9 %   MCV 90.8 80.0 - 100.0 fL   MCH 30.3 26.0 - 34.0 pg   MCHC 33.4 30.0 - 36.0 g/dL   RDW 87.7 88.4 - 84.4 %   Platelets 261 150 - 400 K/uL   nRBC 0.0 0.0 - 0.2 %   Neutrophils Relative % 81 %   Neutro Abs 12.4 (H) 1.7 - 7.7 K/uL   Lymphocytes Relative 13 %   Lymphs Abs 2.0 0.7 - 4.0 K/uL   Monocytes Relative 4 %   Monocytes Absolute 0.7 0.1 - 1.0 K/uL   Eosinophils Relative 1 %   Eosinophils Absolute 0.1 0.0 - 0.5 K/uL   Basophils Relative 0 %   Basophils  Absolute 0.0 0.0 - 0.1 K/uL   Immature Granulocytes 1 %   Abs Immature Granulocytes 0.07 0.00 - 0.07 K/uL    Comment: Performed at [facility], [address], [place],  [place] [address]  Comprehensive metabolic panel     Status: Abnormal   Collection Time: 06/28/24 12:39 PM  Result Value Ref Range   Sodium 139 135 - 145 mmol/L   Potassium 4.0 3.5 - 5.1 mmol/L   Chloride 104 98 - 111 mmol/L   CO2 21 (L) 22 - 32 mmol/L   Glucose, Bld 94 70 - 99 mg/dL    Comment: Glucose reference range applies only to samples taken after fasting for at least 8 hours.   BUN 10 6 - 20 mg/dL   Creatinine, Ser 9.02 0.44 - 1.00 mg/dL   Calcium 9.6 8.9 - 89.6 mg/dL   Total Protein 7.1 6.5 - 8.1 g/dL   Albumin 4.4 3.5 - 5.0 g/dL   AST 39 15 - 41 U/L   ALT 31 0 - 44 U/L   Alkaline Phosphatase 41 38 - 126 U/L   Total Bilirubin 0.6 0.0 - 1.2 mg/dL   GFR, Estimated >39 >39 mL/min    Comment: (NOTE) Calculated using the CKD-EPI Creatinine Equation (2021)    Anion gap 13 5 - 15    Comment: Performed at [facility], [address], [place], [place] [address]  Lipase, blood     Status: None   Collection Time: 06/28/24 12:39 PM  Result Value Ref Range   Lipase 26 11 - 51 U/L    Comment: Performed at [facility], [address] [place], [place] [address]  Urinalysis, Routine w reflex microscopic -Urine, Clean Catch     Status: Abnormal   Collection Time: 06/28/24 12:43 PM  Result Value Ref Range   Color, Urine YELLOW YELLOW   APPearance CLEAR CLEAR   Specific Gravity, Urine 1.028 1.005 - 1.030   pH 5.5 5.0 - 8.0   Glucose, UA NEGATIVE NEGATIVE mg/dL   Hgb urine dipstick NEGATIVE NEGATIVE   Bilirubin Urine NEGATIVE NEGATIVE   Ketones, ur TRACE (A) NEGATIVE mg/dL   Protein, ur NEGATIVE NEGATIVE mg/dL   Nitrite NEGATIVE NEGATIVE   Leukocytes,Ua NEGATIVE NEGATIVE    Comment: Performed at [facility], [address], [place], [place] [address]  Pregnancy, urine POC     Status: None   Collection Time: 06/28/24  3:56 PM  Result Value Ref Range   Preg Test, Ur NEGATIVE NEGATIVE    Comment:        THE SENSITIVITY OF  THIS METHODOLOGY IS >20 mIU/mL.     CT ABDOMEN PELVIS W CONTRAST Result Date: 06/28/2024 CLINICAL DATA:  Right lower quadrant pain EXAM: CT ABDOMEN AND PELVIS WITH CONTRAST TECHNIQUE: Multidetector CT imaging of the abdomen and pelvis was performed using the standard protocol following bolus administration of intravenous contrast. RADIATION DOSE REDUCTION: This exam was performed according to the departmental dose-optimization program which includes automated exposure control, adjustment of the mA and/or kV according to patient size and/or use of iterative reconstruction technique. CONTRAST:  OMNIPAQUE  IOHEXOL  300 MG/ML  SOLN COMPARISON:  None Available. FINDINGS: Lower chest: No acute abnormality Hepatobiliary: No focal hepatic abnormality. Gallbladder unremarkable. Pancreas: No focal abnormality or ductal dilatation. Spleen: No focal abnormality.  Normal size. Adrenals/Urinary Tract: No adrenal abnormality. No focal renal abnormality. No stones or hydronephrosis. Urinary bladder is unremarkable. Stomach/Bowel: Appendix is mildly prominent measuring up to 8 mm  with surrounding haziness. Findings concerning for early acute appendicitis. Stomach, large and small bowel grossly unremarkable. Vascular/Lymphatic: No evidence of aneurysm or adenopathy. Reproductive: Uterus and adnexa unremarkable.  No mass. Other: No free fluid or free air. Musculoskeletal: No acute bony abnormality. IMPRESSION: Slightly dilated appendix with surrounding haziness concerning for early acute appendicitis. These results were called by telephone at the time of interpretation on 06/28/2024 at 2:21 pm to provider [facility] , who verbally acknowledged these results. Electronically Signed   By: Franky Crease M.D.   On: 06/28/2024 14:23     A/P: Lori Zavala is an 48 y.o. female with ADHD, risk for malignant hyperthermia, GAD, myotonia congenita  -The anatomy and physiology of the GI tract was discussed with her. The  pathophysiology of appendicitis was discussed as well. -We reviewed options moving forward for treatment, covering IV abx vs surgery. We discussed that with antibiotics alone, there is reasonable success in managing appendicitis, however, risks of recurrence at 50yrs being as high as 40% in some studies. We discussed appendectomy - laparoscopic and potential open techniques as well as scenarios where an ileocecectomy could be necessary. We discussed the material risks (including, but not limited to, pain, bleeding, infection, blood clot, pulmonary embolus, scarring, need for blood transfusion, damage to surrounding structures- blood vessels/nerves/viscus/organs, damage to ureter/bladder, leak from staple line, need for additional procedures, hernia, recurrence although quite low, pneumonia, heart attack, stroke, death) benefits and alternatives to surgery were discussed. The patient's questions were answered to her satisfaction, she voiced understanding and elected to proceed with surgery. Additionally, we discussed typical postoperative expectations and the recovery process.  - Reviewed at bedside alongside anesthesia extensively. - Also discussed with her discussing with Dr. Mahlon regarding timing on resumption of her Wegovy  versus Zepbound   I spent a total of 75 minutes in both face-to-face and non-face-to-face activities, excluding procedures performed, for this visit on the date of this encounter.  Lonni Pizza, MD [facility], A [facility]

## 2024-06-28 NOTE — Anesthesia Preprocedure Evaluation (Signed)
 Anesthesia Evaluation  Patient identified by MRN, date of birth, ID band Patient awake    Reviewed: Allergy & Precautions, H&P , NPO status , Patient's Chart, lab work & pertinent test results  Airway Mallampati: II  TM Distance: >3 FB Neck ROM: Full    Dental  (+) Dental Advisory Given   Pulmonary neg pulmonary ROS   breath sounds clear to auscultation       Cardiovascular (-) angina negative cardio ROS  Rhythm:Regular Rate:Normal     Neuro/Psych  Headaches PSYCHIATRIC DISORDERS Anxiety      Neuromuscular disease    GI/Hepatic negative GI ROS, Neg liver ROS,,,  Endo/Other  negative endocrine ROS    Renal/GU negative Renal ROS  negative genitourinary   Musculoskeletal negative musculoskeletal ROS (+)    Abdominal   Peds negative pediatric ROS (+)  Hematology  (+) Blood dyscrasia, anemia   Anesthesia Other Findings Myotonia congenita,  Reproductive/Obstetrics negative OB ROS                              Anesthesia Physical Anesthesia Plan  ASA: 3  Anesthesia Plan: General   Post-op Pain Management: Tylenol  PO (pre-op)*   Induction: Intravenous  PONV Risk Score and Plan: 3 and Ondansetron , Dexamethasone , Treatment may vary due to age or medical condition, Midazolam , Propofol  infusion and TIVA  Airway Management Planned: Oral ETT  Additional Equipment: None  Intra-op Plan:   Post-operative Plan: Extubation in OR  Informed Consent: I have reviewed the patients History and Physical, chart, labs and discussed the procedure including the risks, benefits and alternatives for the proposed anesthesia with the patient or authorized representative who has indicated his/her understanding and acceptance.     Dental advisory given  Plan Discussed with: CRNA  Anesthesia Plan Comments:          Anesthesia Quick Evaluation

## 2024-06-28 NOTE — ED Triage Notes (Signed)
 Started yesterday with back pain Now lower right side pain today, sharp stabbing Advised to go to ed for appy r/o

## 2024-06-28 NOTE — ED Notes (Signed)
 Called [facility] to transport patient to [facility] Pre-op per PA [facility]

## 2024-06-29 ENCOUNTER — Encounter (HOSPITAL_COMMUNITY): Payer: Self-pay | Admitting: Surgery

## 2024-06-29 NOTE — Anesthesia Postprocedure Evaluation (Signed)
 Anesthesia Post Note  Patient: Lori Zavala  Procedure(s) Performed: APPENDECTOMY, LAPAROSCOPIC     Patient location during evaluation: PACU Anesthesia Type: General Level of consciousness: awake and alert Pain management: pain level controlled Vital Signs Assessment: post-procedure vital signs reviewed and stable Respiratory status: spontaneous breathing, nonlabored ventilation, respiratory function stable and patient connected to nasal cannula oxygen Cardiovascular status: blood pressure returned to baseline and stable Postop Assessment: no apparent nausea or vomiting Anesthetic complications: no   No notable events documented.  Last Vitals:  Vitals:   06/28/24 2000 06/28/24 2015  BP: 115/63 118/73  Pulse: 75 80  Resp: 12 12  Temp:  36.7 C  SpO2: 99% 99%    Last Pain:  Vitals:   06/28/24 2015  TempSrc:   PainSc: 4                  Epifanio Lamar BRAVO

## 2024-07-01 LAB — SURGICAL PATHOLOGY

## 2024-07-25 ENCOUNTER — Encounter: Payer: Self-pay | Admitting: Family Medicine

## 2024-07-25 ENCOUNTER — Other Ambulatory Visit (HOSPITAL_BASED_OUTPATIENT_CLINIC_OR_DEPARTMENT_OTHER): Payer: Self-pay

## 2024-07-25 MED ORDER — TIRZEPATIDE-WEIGHT MANAGEMENT 2.5 MG/0.5ML ~~LOC~~ SOAJ
2.5000 mg | SUBCUTANEOUS | 1 refills | Status: DC
  Filled 2024-07-25: qty 2, 28d supply, fill #0

## 2024-07-25 MED ORDER — DYANAVEL XR 10 MG PO TBCR: 1.0000 | EXTENDED_RELEASE_TABLET | Freq: Every day | ORAL | 0 refills | Status: DC

## 2024-07-25 NOTE — Addendum Note (Signed)
 Addended by: Enslie Sahota E on: 07/25/2024 01:28 PM   Modules accepted: Orders

## 2024-07-27 ENCOUNTER — Other Ambulatory Visit (HOSPITAL_BASED_OUTPATIENT_CLINIC_OR_DEPARTMENT_OTHER): Payer: Self-pay

## 2024-07-27 ENCOUNTER — Other Ambulatory Visit: Payer: Self-pay

## 2024-07-27 ENCOUNTER — Other Ambulatory Visit: Payer: Self-pay | Admitting: Family Medicine

## 2024-07-27 MED ORDER — TIRZEPATIDE 2.5 MG/0.5ML ~~LOC~~ SOAJ
2.5000 mg | SUBCUTANEOUS | 1 refills | Status: DC
  Filled 2024-07-27: qty 2, 28d supply, fill #0

## 2024-07-27 NOTE — Progress Notes (Signed)
 Prescription changed to Mounjaro  due to insurance coverage

## 2024-07-28 ENCOUNTER — Other Ambulatory Visit (HOSPITAL_COMMUNITY): Payer: Self-pay

## 2024-08-18 ENCOUNTER — Encounter: Payer: Self-pay | Admitting: Family Medicine

## 2024-08-21 ENCOUNTER — Other Ambulatory Visit (HOSPITAL_BASED_OUTPATIENT_CLINIC_OR_DEPARTMENT_OTHER): Payer: Self-pay

## 2024-08-21 MED ORDER — TIRZEPATIDE 5 MG/0.5ML ~~LOC~~ SOAJ
5.0000 mg | SUBCUTANEOUS | 1 refills | Status: DC
  Filled 2024-08-21: qty 6, 84d supply, fill #0
  Filled 2024-08-22: qty 2, 28d supply, fill #0
  Filled 2024-09-18: qty 2, 28d supply, fill #1
  Filled 2024-10-12: qty 2, 28d supply, fill #2
  Filled 2024-11-09 – 2024-11-21 (×2): qty 2, 28d supply, fill #3

## 2024-08-21 NOTE — Addendum Note (Signed)
 Addended by: JERRELL SOLIAN T on: 08/21/2024 07:26 AM   Modules accepted: Orders

## 2024-08-22 ENCOUNTER — Other Ambulatory Visit (HOSPITAL_BASED_OUTPATIENT_CLINIC_OR_DEPARTMENT_OTHER): Payer: Self-pay

## 2024-09-29 ENCOUNTER — Encounter: Payer: Self-pay | Admitting: Family Medicine

## 2024-09-29 ENCOUNTER — Encounter: Payer: 59 | Admitting: Family Medicine

## 2024-09-29 MED ORDER — DYANAVEL XR 10 MG PO TBCR: 1.0000 | EXTENDED_RELEASE_TABLET | Freq: Every day | ORAL | 0 refills | Status: DC

## 2024-09-29 NOTE — Telephone Encounter (Signed)
 Requesting: Amphetamine  ER 10 MG Contract: No UDS: No Last Visit: 12/09/2023 Next Visit: 10/06/2024 Last Refill: 07/25/2024  Please Advise

## 2024-10-06 ENCOUNTER — Ambulatory Visit: Payer: 59 | Admitting: Family Medicine

## 2024-10-06 ENCOUNTER — Encounter: Payer: Self-pay | Admitting: Family Medicine

## 2024-10-06 VITALS — BP 108/62 | HR 112 | Temp 98.5°F | Resp 16 | Ht 63.0 in | Wt 143.2 lb

## 2024-10-06 DIAGNOSIS — E663 Overweight: Secondary | ICD-10-CM | POA: Diagnosis not present

## 2024-10-06 DIAGNOSIS — Z0000 Encounter for general adult medical examination without abnormal findings: Secondary | ICD-10-CM | POA: Diagnosis not present

## 2024-10-06 LAB — HEPATIC FUNCTION PANEL
ALT: 18 U/L (ref 0–35)
AST: 17 U/L (ref 0–37)
Albumin: 3.9 g/dL (ref 3.5–5.2)
Alkaline Phosphatase: 27 U/L — ABNORMAL LOW (ref 39–117)
Bilirubin, Direct: 0.1 mg/dL (ref 0.0–0.3)
Total Bilirubin: 0.5 mg/dL (ref 0.2–1.2)
Total Protein: 6.5 g/dL (ref 6.0–8.3)

## 2024-10-06 LAB — LIPID PANEL
Cholesterol: 169 mg/dL (ref 0–200)
HDL: 59.2 mg/dL (ref >39.00)
LDL Cholesterol: 91 mg/dL (ref 0–99)
NonHDL: 109.62
Total CHOL/HDL Ratio: 3
Triglycerides: 91 mg/dL (ref 0.0–149.0)
VLDL: 18.2 mg/dL (ref 0.0–40.0)

## 2024-10-06 LAB — CBC WITH DIFFERENTIAL/PLATELET
Basophils Absolute: 0 K/uL (ref 0.0–0.1)
Basophils Relative: 0.6 % (ref 0.0–3.0)
Eosinophils Absolute: 0.1 K/uL (ref 0.0–0.7)
Eosinophils Relative: 1.9 % (ref 0.0–5.0)
HCT: 36.3 % (ref 36.0–46.0)
Hemoglobin: 12.4 g/dL (ref 12.0–15.0)
Lymphocytes Relative: 46.1 % — ABNORMAL HIGH (ref 12.0–46.0)
Lymphs Abs: 1.9 K/uL (ref 0.7–4.0)
MCHC: 34.3 g/dL (ref 30.0–36.0)
MCV: 88.3 fl (ref 78.0–100.0)
Monocytes Absolute: 0.3 K/uL (ref 0.1–1.0)
Monocytes Relative: 7.7 % (ref 3.0–12.0)
Neutro Abs: 1.8 K/uL (ref 1.4–7.7)
Neutrophils Relative %: 43.7 % (ref 43.0–77.0)
Platelets: 219 K/uL (ref 150.0–400.0)
RBC: 4.11 Mil/uL (ref 3.87–5.11)
RDW: 12.6 % (ref 11.5–15.5)
WBC: 4.1 K/uL (ref 4.0–10.5)

## 2024-10-06 LAB — BASIC METABOLIC PANEL WITH GFR
BUN: 13 mg/dL (ref 6–23)
CO2: 26 meq/L (ref 19–32)
Calcium: 8.8 mg/dL (ref 8.4–10.5)
Chloride: 105 meq/L (ref 96–112)
Creatinine, Ser: 0.97 mg/dL (ref 0.40–1.20)
GFR: 69.54 mL/min (ref >60.00)
Glucose, Bld: 76 mg/dL (ref 70–99)
Potassium: 4.4 meq/L (ref 3.5–5.1)
Sodium: 139 meq/L (ref 135–145)

## 2024-10-06 LAB — TSH: TSH: 1.53 u[IU]/mL (ref 0.35–5.50)

## 2024-10-06 LAB — VITAMIN D 25 HYDROXY (VIT D DEFICIENCY, FRACTURES): VITD: 90.18 ng/mL (ref 30.00–100.00)

## 2024-10-06 NOTE — Patient Instructions (Signed)
Follow up in 1 year or as needed We'll notify you of your lab results and make any changes if needed Keep up the good work!  You look great! Call with any questions or concerns Stay Safe!  Stay Healthy! Happy Holidays!!! 

## 2024-10-06 NOTE — Progress Notes (Signed)
   Subjective:    Patient ID: Lori Zavala, female    DOB: November 17, 1976, 48 y.o.   MRN: [id number]  HPI CPE- UTD on colonoscopy, pap.  Due for mammo- scheduled.  UTD on Tdap.  Patient Care Team    Relationship Specialty Notifications Start End  Mahlon Comer BRAVO, MD PCP - General Family Medicine  12/03/14     Health Maintenance  Topic Date Due   Hepatitis B Vaccines 19-59 Average Risk (1 of 3 - 19+ 3-dose series) Never done   Mammogram  08/15/2023   COVID-19 Vaccine (4 - 2025-26 season) 07/24/2024   Influenza Vaccine  02/20/2025 (Originally 06/23/2024)   DTaP/Tdap/Td (3 - Td or Tdap) 07/15/2025   Cervical Cancer Screening (HPV/Pap Cotest)  08/27/2025   Colonoscopy  09/22/2032   Hepatitis C Screening  Completed   HIV Screening  Completed   Pneumococcal Vaccine  Aged Out   HPV VACCINES  Aged Out   Meningococcal B Vaccine  Aged Out      Review of Systems Patient reports no vision/ hearing changes, adenopathy,fever, weight change,  persistant/recurrent hoarseness , swallowing issues, chest pain, palpitations, edema, persistant/recurrent cough, hemoptysis, dyspnea (rest/exertional/paroxysmal nocturnal), gastrointestinal bleeding (melena, rectal bleeding), abdominal pain, significant heartburn, bowel changes, GU symptoms (dysuria, hematuria, incontinence), Gyn symptoms (abnormal  bleeding, pain),  syncope, focal weakness, memory loss, numbness & tingling, skin/hair/nail changes, abnormal bruising or bleeding, anxiety, or depression.     Objective:   Physical Exam General Appearance:    Alert, cooperative, no distress, appears stated age  Head:    Normocephalic, without obvious abnormality, atraumatic  Eyes:    PERRL, conjunctiva/corneas clear, EOM's intact both eyes  Ears:    Normal TM's and external ear canals, both ears  Nose:   Nares normal, septum midline, mucosa normal, no drainage    or sinus tenderness  Throat:   Lips, mucosa, and tongue normal; teeth and gums normal  Neck:    Supple, symmetrical, trachea midline, no adenopathy;    Thyroid : no enlargement/tenderness/nodules  Back:     Symmetric, no curvature, ROM normal, no CVA tenderness  Lungs:     Clear to auscultation bilaterally, respirations unlabored  Chest Wall:    No tenderness or deformity   Heart:    Regular rate and rhythm, S1 and S2 normal, no murmur, rub   or gallop  Breast Exam:    Deferred to GYN  Abdomen:     Soft, non-tender, bowel sounds active all four quadrants,    no masses, no organomegaly  Genitalia:    Deferred to GYN  Rectal:    Extremities:   Extremities normal, atraumatic, no cyanosis or edema  Pulses:   2+ and symmetric all extremities  Skin:   Skin color, texture, turgor normal, no rashes or lesions  Lymph nodes:   Cervical, supraclavicular, and axillary nodes normal  Neurologic:   CNII-XII intact, normal strength, sensation and reflexes    throughout          Assessment & Plan:

## 2024-10-08 NOTE — Assessment & Plan Note (Signed)
 Pt's PE WNL.  UTD on pap, mammo, colonoscopy.  UTD on Tdap.  Declines flu.  Check labs.  Anticipatory guidance provided.

## 2024-10-09 ENCOUNTER — Ambulatory Visit: Payer: Self-pay | Admitting: Family Medicine

## 2024-10-12 ENCOUNTER — Other Ambulatory Visit: Payer: Self-pay

## 2024-10-27 ENCOUNTER — Encounter: Payer: Self-pay | Admitting: Family Medicine

## 2024-10-27 MED ORDER — DYANAVEL XR 10 MG PO TBCR: 1.0000 | EXTENDED_RELEASE_TABLET | Freq: Every day | ORAL | 0 refills | Status: DC

## 2024-10-27 NOTE — Telephone Encounter (Signed)
 Refill request received with PCP out of office.  Physical noted with Dr. Mahlon November 14.  Controlled substance database reviewed.  Dyanavel  SR 10 mg #30 last filled on 10/04/2024.  Previously 07/28/2024.  Refill would be 1 week early, will defer to PCP.

## 2024-10-27 NOTE — Telephone Encounter (Signed)
 Okay to refill medication while Dr. Mahlon is out of office? Patient would like it sent to [facility] order pharmacy

## 2024-10-30 NOTE — Telephone Encounter (Signed)
 FYI - pharmacy is not able to complete 90D supply. Only 30D   Copied from CRM #[id number]. Topic: Clinical - Prescription Issue >> Oct 30, 2024  9:50 AM Rea ORN wrote: Reason for CRM: Shivani with [facility] called to advise Amphetamine  ER (DYANAVEL  XR) 10 MG is a controlled substance and they do not accept 90 day supply requests.  They will fill 30 days and will void out 60 days on recent rx. PCP will have to send another rx in 30 days.   If there are any questions, please call back [phone]

## 2024-11-21 ENCOUNTER — Other Ambulatory Visit (HOSPITAL_BASED_OUTPATIENT_CLINIC_OR_DEPARTMENT_OTHER): Payer: Self-pay

## 2024-12-05 ENCOUNTER — Other Ambulatory Visit (HOSPITAL_BASED_OUTPATIENT_CLINIC_OR_DEPARTMENT_OTHER): Payer: Self-pay

## 2024-12-06 ENCOUNTER — Encounter: Payer: Self-pay | Admitting: Family Medicine

## 2024-12-06 ENCOUNTER — Other Ambulatory Visit (HOSPITAL_BASED_OUTPATIENT_CLINIC_OR_DEPARTMENT_OTHER): Payer: Self-pay

## 2024-12-06 MED ORDER — TIRZEPATIDE 2.5 MG/0.5ML ~~LOC~~ SOAJ
2.5000 mg | SUBCUTANEOUS | 1 refills | Status: AC
  Filled 2024-12-06: qty 2, 28d supply, fill #0

## 2024-12-06 MED ORDER — DYANAVEL XR 10 MG PO TBCR: 1.0000 | EXTENDED_RELEASE_TABLET | Freq: Every day | ORAL | 0 refills | Status: AC

## 2024-12-06 NOTE — Telephone Encounter (Signed)
 Okay to send

## 2024-12-15 ENCOUNTER — Other Ambulatory Visit (HOSPITAL_BASED_OUTPATIENT_CLINIC_OR_DEPARTMENT_OTHER): Payer: Self-pay

## 2025-10-08 ENCOUNTER — Encounter: Admitting: Family Medicine
